# Patient Record
Sex: Male | Born: 1995 | Race: Black or African American | Hispanic: No | Marital: Single | Smoking: Former smoker
Health system: Southern US, Community
[De-identification: ages and names within clinical notes are randomized; demographics above are authoritative.]

## PROBLEM LIST (undated history)

## (undated) DIAGNOSIS — R278 Other lack of coordination: Secondary | ICD-10-CM

## (undated) DIAGNOSIS — F902 Attention-deficit hyperactivity disorder, combined type: Principal | ICD-10-CM

## (undated) DIAGNOSIS — J45909 Unspecified asthma, uncomplicated: Secondary | ICD-10-CM

## (undated) HISTORY — DX: Other lack of coordination: R27.8

## (undated) HISTORY — DX: Attention-deficit hyperactivity disorder, combined type: F90.2

## (undated) HISTORY — DX: Unspecified asthma, uncomplicated: J45.909

---
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
## 2017-03-01 NOTE — Telephone Encounter (Addendum)
Spoke to mom.  She said Ryan Adkins has school and work and she does not know his schedule.  She will have him call as soon as possible to schedule a follow-up appointment.  Patient called back a few minutes later and scheduled appointment.  I reviewed the no-show policy with him.

## 2017-03-08 ENCOUNTER — Telehealth: Payer: Self-pay | Admitting: Pediatrics

## 2017-03-08 ENCOUNTER — Institutional Professional Consult (permissible substitution): Payer: Self-pay | Admitting: Pediatrics

## 2017-03-08 NOTE — Telephone Encounter (Signed)
Patient called back at 9:39 am and said that he over slept. He rescheduled apt for next Friday at 9 am. He is aware of the no show charge for today's missed appt.  jd

## 2017-03-08 NOTE — Telephone Encounter (Signed)
Called  patient  and left a message on all numbers provided in Epic to call the office .

## 2017-03-12 NOTE — Telephone Encounter (Signed)
Patient last seen 2.7.18 (NS'd 5.3 & 8.24.18).  Please call & schedule next follow-up appt during Thanksgiving break.

## 2017-03-13 NOTE — Telephone Encounter (Signed)
See previous msg to JD re next appt needed.

## 2017-03-15 ENCOUNTER — Ambulatory Visit (INDEPENDENT_AMBULATORY_CARE_PROVIDER_SITE_OTHER): Payer: 59 | Admitting: Pediatrics

## 2017-03-15 ENCOUNTER — Encounter: Payer: Self-pay | Admitting: Pediatrics

## 2017-03-15 VITALS — Ht 70.5 in | Wt 227.0 lb

## 2017-03-15 DIAGNOSIS — R278 Other lack of coordination: Secondary | ICD-10-CM

## 2017-03-15 DIAGNOSIS — F902 Attention-deficit hyperactivity disorder, combined type: Secondary | ICD-10-CM

## 2017-03-15 DIAGNOSIS — Z719 Counseling, unspecified: Secondary | ICD-10-CM

## 2017-03-15 DIAGNOSIS — Z79899 Other long term (current) drug therapy: Secondary | ICD-10-CM

## 2017-03-15 DIAGNOSIS — F4010 Social phobia, unspecified: Secondary | ICD-10-CM

## 2017-03-15 MED ORDER — AMPHETAMINE ER 2.5 MG/ML PO SUER: 4.0000 mL | Freq: Every day | ORAL | 0 refills | Status: DC

## 2017-03-15 NOTE — Progress Notes (Signed)
[facility] [address], [place]. [address] [place] [address] Dept: [phone] Dept Fax: [phone] Loc: [phone] Loc Fax: [phone]  Medical Follow-up  Patient ID: Ryan Adkins, male  DOB: [birth year], 21 y.o.  MRN: [id number]  Date of Evaluation: 03/15/17   PCP: Veryl Speak, FNP  Accompanied by: Mother Patient Lives with: mother, father and brother age [name] 21 years  Older brother Renata Caprice, late 30's moved back last summer  HISTORY/CURRENT STATUS:  Chief Complaint - Polite and cooperative and present for medical follow up for medication management of ADHD, dysgraphia and learning differences.  Last follow up February 2018 and prescribed Dyanavel 3 ml up to 6 ml, only using for classes.  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  Homework Time: 30 mins Performance/Grades: average  Doing better on daily medication  Services: IEP/504 Plan   Activities/Exercise: intermittently  Has been going to the gym  MEDICAL HISTORY: Appetite: WNL  Sleep: Bedtime: 2400  Awakens: work days wakes up at [employer] stay up later  Work is about 10 minutes away  Not achieving good night sleep on work nights. Significant lack of sleep on work nights.  Trouble getting comfortable and tosses and turns. Counseled regarding fixing sleep (consider new mattress, mattress topper)   No concerns for toileting. Daily stool, no constipation or diarrhea. Void urine no difficulty. No enuresis.   Participate in daily  oral hygiene to include brushing and flossing.  Individual Medical History/Review of System Changes? No  Allergies: Patient has no known allergies.  Current Medications:  Dyanavel 3 ml occassionally  Medication Side Effects: None  Family Medical/Social History Changes?: No  MENTAL HEALTH: Mental Health Issues:  Denies sadness, loneliness or depression.  No self harm or thoughts of self harm or injury.  Has good peer relations and is not a bully nor is victimized. Worries about brother, has too many tickets for DUI, etc and marijuana. Parents talking about what to do after they die, so patient is worrying Dad has glaucoma  Significant worry with bad dreams and coincidental happenings that he feels relates to his dreams. Completed SCARED today:  Patient score/cut off  Anxiety disorder  18/25 Somatic/panic  3/7 Generalized   0/9 Separation  4/5 Social   8/8 School avoidance 0/3  Discussed and counseled regarding worries, dreams and feelings. Patient does not wish to seek counseling or medication for difficulties.  Review of Systems  Constitutional: Negative.   HENT: Negative.   Eyes: Negative.   Respiratory: Negative.   Cardiovascular: Negative.   Gastrointestinal: Negative.   Genitourinary: Negative.   Musculoskeletal: Negative.   Skin: Negative.   Allergic/Immunologic: Positive for environmental allergies.  Neurological: Negative for seizures and headaches.  Psychiatric/Behavioral: Positive for decreased concentration. Negative for behavioral problems. The patient is nervous/anxious. The patient is not hyperactive.    PHYSICAL EXAM: Vitals:  Today's Vitals   03/15/17 0907  Weight: 227 lb (103 kg)  Height: 5' 10.5" (1.791 m)  , 98 %ile (Z= 1.99) based on CDC  2-20 Years BMI-for-age data using vitals from 03/15/2017. Body mass index is 32.11 kg/m.  General Exam: Physical Exam  Constitutional: He is oriented to person, place, and time. Vital signs are normal.  He appears well-developed and well-nourished. He is cooperative. No distress.  HENT:  Head: Normocephalic.  Right Ear: Tympanic membrane and ear canal normal.  Left Ear: Tympanic membrane and ear canal normal.  Nose: Nose normal.  Mouth/Throat: Uvula is midline, oropharynx is clear and moist and mucous membranes are normal.  Eyes: Pupils are equal, round, and reactive to light. Conjunctivae, EOM and lids are normal.  Neck: Normal range of motion. Neck supple. No thyromegaly present.  Cardiovascular: Normal rate, regular rhythm and intact distal pulses.   Pulmonary/Chest: Effort normal and breath sounds normal.  Abdominal: Soft. Normal appearance.  Genitourinary:  Genitourinary Comments: Deferred  Musculoskeletal: Normal range of motion.  Neurological: He is alert and oriented to person, place, and time. He has normal strength and normal reflexes. He displays no tremor. No cranial nerve deficit or sensory deficit. He exhibits normal muscle tone. He displays a negative Romberg sign. He displays no seizure activity. Coordination and gait normal.  Skin: Skin is warm, dry and intact.  Psychiatric: He has a normal mood and affect. His speech is normal and behavior is normal. Judgment and thought content normal. His mood appears not anxious. His affect is not inappropriate. He is not agitated, not aggressive and not hyperactive. Cognition and memory are normal. He does not express impulsivity or inappropriate judgment. He expresses no suicidal ideation. He expresses no suicidal plans. He is attentive.  Vitals reviewed.   Neurological: oriented to time, place, and person   Testing/Developmental Screens: XBM:WUXLCGI:ASRS 15/6 Reviewed with patient        DIAGNOSES:    ICD-10-CM   1. ADHD (attention deficit hyperactivity disorder), combined type F90.2   2. Dysgraphia R27.8   3. Social anxiety disorder F40.10   4. Medication management Z79.899   5. Patient counseled Z71.9     RECOMMENDATIONS:    Patient Instructions  DISCUSSION: Patient and family counseled regarding the following coordination of care items:  Continue medication as directed Dyanavel 3 to 6 ml as needed for classes/work  Counseled medication administration, effects, and possible side effects.  ADHD medications discussed to include different medications and pharmacologic properties of each. Recommendation for specific medication to include dose, administration, expected effects, possible side effects and the risk to benefit ratio of medication management.  Advised importance of:  Good sleep hygiene (8- 10 hours per night) Attempt to improve bed/frame/mattress Limited screen time (none on school nights, no more than 2 hours on weekends) Technology bedtime to include no phone on work nights by at least 2 hours before bed Regular exercise(outside and active play) Healthy eating (drink water, no sodas/sweet tea, limit portions and no seconds).  Teens need about 9 hours of sleep a night. Younger children need more sleep (10-11 hours a night) and adults need slightly less (7-9 hours each night).  11 Tips to Follow:  1. No caffeine after 3pm: Avoid beverages with caffeine (soda, tea, energy drinks, etc.) especially after 3pm. 2. Don't go to bed hungry: Have your evening meal at least 3 hrs. before going to sleep. It's fine to have a small bedtime snack such as a glass of milk and a few crackers but don't have a big meal. 3. Have a nightly routine before bed: Plan on "winding down" before you go to sleep. Begin relaxing about 1  hour before you go to bed. Try doing a quiet activity such as listening to calming music, reading a book or meditating. 4. Turn off the TV and ALL electronics including video games, tablets, laptops, etc. 1 hour before sleep, and keep them out of the bedroom. 5. Turn off your cell phone and all notifications (new email and text alerts) or even better, leave your phone outside your room while you sleep.  Studies have shown that a part of your brain continues to respond to certain lights and sounds even while you're still asleep. 6. Make your bedroom quiet, dark and cool. If you can't control the noise, try wearing earplugs or using a fan to block out other sounds. 7. Practice relaxation techniques. Try reading a book or meditating or drain your brain by writing a list of what you need to do the next day. 8. Don't nap unless you feel sick: you'll have a better night's sleep. 9. Don't smoke, or quit if you do. Nicotine, alcohol, and marijuana can all keep you awake. Talk to your health care provider if you need help with substance use. 10. Most importantly, wake up at the same time every day (or within 1 hour of your usual wake up time) EVEN on the weekends. A regular wake up time promotes sleep hygiene and prevents sleep problems. 11. Reduce exposure to bright light in the last three hours of the day before going to sleep. Maintaining good sleep hygiene and having good sleep habits lower your risk of developing sleep problems. Getting better sleep can also improve your concentration and alertness. Try the simple steps in this guide. If you still have trouble getting enough rest, make an appointment with your health care provider.    Patient verbalized understanding of all topics discussed.    NEXT APPOINTMENT: Return in about 6 months (around 09/12/2017) for Medical Follow up. Medical Decision-making: More than 50% of the appointment was spent counseling and discussing diagnosis and management of symptoms with the patient and family.   Leticia Penna, NP Counseling Time: 40 Total Contact Time: 50

## 2017-03-15 NOTE — Patient Instructions (Addendum)
DISCUSSION: Patient and family counseled regarding the following coordination of care items:  Continue medication as directed Dyanavel 3 to 6 ml as needed for classes/work  Counseled medication administration, effects, and possible side effects.  ADHD medications discussed to include different medications and pharmacologic properties of each. Recommendation for specific medication to include dose, administration, expected effects, possible side effects and the risk to benefit ratio of medication management.  Advised importance of:  Good sleep hygiene (8- 10 hours per night) Attempt to improve bed/frame/mattress Limited screen time (none on school nights, no more than 2 hours on weekends) Technology bedtime to include no phone on work nights by at least 2 hours before bed Regular exercise(outside and active play) Healthy eating (drink water, no sodas/sweet tea, limit portions and no seconds).  Teens need about 9 hours of sleep a night. Younger children need more sleep (10-11 hours a night) and adults need slightly less (7-9 hours each night).  11 Tips to Follow:  1. No caffeine after 3pm: Avoid beverages with caffeine (soda, tea, energy drinks, etc.) especially after 3pm. 2. Don't go to bed hungry: Have your evening meal at least 3 hrs. before going to sleep. It's fine to have a small bedtime snack such as a glass of milk and a few crackers but don't have a big meal. 3. Have a nightly routine before bed: Plan on "winding down" before you go to sleep. Begin relaxing about 1 hour before you go to bed. Try doing a quiet activity such as listening to calming music, reading a book or meditating. 4. Turn off the TV and ALL electronics including video games, tablets, laptops, etc. 1 hour before sleep, and keep them out of the bedroom. 5. Turn off your cell phone and all notifications (new email and text alerts) or even better, leave your phone outside your room while you sleep. Studies have shown that  a part of your brain continues to respond to certain lights and sounds even while you're still asleep. 6. Make your bedroom quiet, dark and cool. If you can't control the noise, try wearing earplugs or using a fan to block out other sounds. 7. Practice relaxation techniques. Try reading a book or meditating or drain your brain by writing a list of what you need to do the next day. 8. Don't nap unless you feel sick: you'll have a better night's sleep. 9. Don't smoke, or quit if you do. Nicotine, alcohol, and marijuana can all keep you awake. Talk to your health care provider if you need help with substance use. 10. Most importantly, wake up at the same time every day (or within 1 hour of your usual wake up time) EVEN on the weekends. A regular wake up time promotes sleep hygiene and prevents sleep problems. 11. Reduce exposure to bright light in the last three hours of the day before going to sleep. Maintaining good sleep hygiene and having good sleep habits lower your risk of developing sleep problems. Getting better sleep can also improve your concentration and alertness. Try the simple steps in this guide. If you still have trouble getting enough rest, make an appointment with your health care provider.

## 2017-06-05 ENCOUNTER — Ambulatory Visit (HOSPITAL_COMMUNITY)
Admission: EM | Admit: 2017-06-05 | Discharge: 2017-06-05 | Disposition: A | Discharge status: Home / Self Care | Payer: 59 | Attending: Family Medicine | Admitting: Family Medicine

## 2017-06-05 ENCOUNTER — Encounter (HOSPITAL_COMMUNITY): Payer: Self-pay | Admitting: Emergency Medicine

## 2017-06-05 ENCOUNTER — Ambulatory Visit: Payer: Self-pay | Admitting: Internal Medicine

## 2017-06-05 DIAGNOSIS — J00 Acute nasopharyngitis [common cold]: Secondary | ICD-10-CM

## 2017-06-05 MED ORDER — PHENOL 1.4 % MT LIQD: 1.0000 | OROMUCOSAL | 0 refills | Status: DC | PRN

## 2017-06-05 MED ORDER — FLUTICASONE PROPIONATE 50 MCG/ACT NA SUSP: 2.0000 | Freq: Every day | NASAL | 0 refills | Status: AC

## 2017-06-05 MED ORDER — ALBUTEROL SULFATE HFA 108 (90 BASE) MCG/ACT IN AERS: 2.0000 | INHALATION_SPRAY | Freq: Four times a day (QID) | RESPIRATORY_TRACT | 0 refills | Status: DC | PRN

## 2017-06-05 MED ORDER — CETIRIZINE-PSEUDOEPHEDRINE ER 5-120 MG PO TB12: 1.0000 | ORAL_TABLET | Freq: Every day | ORAL | 0 refills | Status: AC

## 2017-06-05 NOTE — ED Provider Notes (Signed)
[facility]    CSN: [id number] Arrival date & time: 06/05/17  1651     History   Chief Complaint Chief Complaint  Patient presents with  . URI    HPI Ryan Adkins is a 21 y.o. male.   21 year old male comes in with history of asthma, ADHD comes in for 4 day history of URI symptoms. Nonproductive cough, nasal congestion, rhinorrhea, sore throat, headache. Fever with Tmax 102, was given tylenol, last dose yesterday. Has had to use albuterol at night due to wheezing/coughing. Positive sick contact. Never smoker.       Past Medical History:  Diagnosis Date  . ADHD (attention deficit hyperactivity disorder), combined type 05/12/2010   Qualifier: Diagnosis of  By: Beverely Lowabori MD, Natalia LeatherwoodKatherine    . Asthma   . Dysgraphia 11/30/2015    Patient Active Problem List   Diagnosis Date Noted  . Biceps tendinitis of right shoulder 09/06/2016  . Cervical paraspinal muscle spasm 09/06/2016  . Dysgraphia 11/30/2015  . Dry skin 09/30/2014  . Asthma 09/30/2014  . ADHD (attention deficit hyperactivity disorder), combined type 05/12/2010  . ALLERGIC RHINITIS, SEASONAL 05/12/2010    History reviewed. No pertinent surgical history.     Home Medications    Prior to Admission medications   Medication Sig Start Date End Date Taking? Authorizing Provider  albuterol (PROVENTIL HFA;VENTOLIN HFA) 108 (90 Base) MCG/ACT inhaler Inhale 2 puffs into the lungs every 6 (six) hours as needed for wheezing or shortness of breath. 06/05/17   Cathie HoopsYu, Jariel Drost V, PA-C  Albuterol Sulfate (PROAIR RESPICLICK) 108 (90 BASE) MCG/ACT AEPB Inhale 1-2 puffs into the lungs every 4 (four) hours as needed. 09/30/14   Veryl Speakalone, Gregory D, FNP  Amphetamine ER (DYANAVEL XR) 2.5 MG/ML SUER Take 4-8 mLs by mouth daily after breakfast. 03/15/17   Crump, Bobi A, NP  cetirizine (ZYRTEC) 1 MG/ML syrup Take 10 mg by mouth daily.    [provider]  cetirizine-pseudoephedrine (ZYRTEC-D) 5-120 MG tablet Take 1 tablet by  mouth daily. 06/05/17   Cathie HoopsYu, Stewart Pimenta V, PA-C  fluticasone (FLONASE) 50 MCG/ACT nasal spray Place 2 sprays into both nostrils daily. 06/05/17   Cathie HoopsYu, Betzabe Bevans V, PA-C  montelukast (SINGULAIR) 10 MG tablet Take 1 tablet (10 mg total) by mouth at bedtime. 04/02/15   Judi SaaSmith, Zachary M, DO  phenol (CHLORASEPTIC) 1.4 % LIQD Use as directed 1 spray in the mouth or throat as needed for throat irritation / pain. 06/05/17   Cathie HoopsYu, Margerie Fraiser V, PA-C  triamcinolone (NASACORT) 55 MCG/ACT AERO nasal inhaler Place 2 sprays into the nose daily.    [provider]    Family History Family History  Problem Relation Age of Onset  . Diabetes Mother   . Cancer Other        Prostate Cancer-Grandfather  . Hyperlipidemia Neg Hx   . Hypertension Neg Hx   . Stroke Neg Hx     Social History Social History   Tobacco Use  . Smoking status: Former Smoker    Last attempt to quit: 03/14/2016    Years since quitting: 1.2  . Smokeless tobacco: Never Used  Substance Use Topics  . Alcohol use: No    Alcohol/week: 0.0 oz  . Drug use: No    Comment: stopped Summer 2018     Allergies   Patient has no known allergies.   Review of Systems Review of Systems  Reason unable to perform ROS: See HPI as above.  Physical Exam Triage Vital Signs ED Triage Vitals [06/05/17 1710]  Enc Vitals Group     BP 109/70     Pulse Rate 78     Resp 18     Temp 98.5 F (36.9 C)     Temp Source Oral     SpO2 100 %     Weight      Height      Head Circumference      Peak Flow      Pain Score      Pain Loc      Pain Edu?      Excl. in GC?    No data found.  Updated Vital Signs BP 109/70 (BP Location: Left Arm)   Pulse 78   Temp 98.5 F (36.9 C) (Oral)   Resp 18   SpO2 100%   Physical Exam  Constitutional: He is oriented to person, place, and time. He appears well-developed and well-nourished. No distress.  HENT:  Head: Normocephalic and atraumatic.  Right Ear: External ear and ear canal normal. Tympanic membrane is  erythematous. Tympanic membrane is not bulging.  Left Ear: External ear and ear canal normal. Tympanic membrane is erythematous. Tympanic membrane is not bulging.  Nose: Mucosal edema and rhinorrhea present. Right sinus exhibits maxillary sinus tenderness and frontal sinus tenderness. Left sinus exhibits maxillary sinus tenderness and frontal sinus tenderness.  Mouth/Throat: Uvula is midline, oropharynx is clear and moist and mucous membranes are normal. Tonsils are 1+ on the right. Tonsils are 1+ on the left. No tonsillar exudate.  Eyes: Conjunctivae are normal. Pupils are equal, round, and reactive to light.  Neck: Normal range of motion. Neck supple.  Cardiovascular: Normal rate, regular rhythm and normal heart sounds. Exam reveals no gallop and no friction rub.  No murmur heard. Pulmonary/Chest: Effort normal.  Mild scattered wheezing.   Lymphadenopathy:    He has no cervical adenopathy.  Neurological: He is alert and oriented to person, place, and time.  Skin: Skin is warm and dry.  Psychiatric: He has a normal mood and affect. His behavior is normal. Judgment normal.     UC Treatments / Results  Labs (all labs ordered are listed, but only abnormal results are displayed) Labs Reviewed - No data to display  EKG  EKG Interpretation None       Radiology No results found.  Procedures Procedures (including critical care time)  Medications Ordered in UC Medications - No data to display   Initial Impression / Assessment and Plan / UC Course  I have reviewed the triage vital signs and the nursing notes.  Pertinent labs & imaging results that were available during my care of the patient were reviewed by me and considered in my medical decision making (see chart for details).    Discussed with patient history and exam most consistent with viral URI. Albuterol for wheezing/shortness of breath. Symptomatic treatment as needed. Push fluids. Return precautions given.    Final  Clinical Impressions(s) / UC Diagnoses   Final diagnoses:  Acute nasopharyngitis    ED Discharge Orders        Ordered    albuterol (PROVENTIL HFA;VENTOLIN HFA) 108 (90 Base) MCG/ACT inhaler  Every 6 hours PRN     06/05/17 1746    cetirizine-pseudoephedrine (ZYRTEC-D) 5-120 MG tablet  Daily     06/05/17 1746    fluticasone (FLONASE) 50 MCG/ACT nasal spray  Daily     06/05/17 1746    phenol (CHLORASEPTIC) 1.4 %  LIQD  As needed     06/05/17 1746        Belinda Fisher, PA-C 06/05/17 1842

## 2017-06-05 NOTE — Discharge Instructions (Signed)
Phenol for sore throat. Start flonase, zyrtec-D for nasal congestion. Albuterol for wheezing/shortness of breath. You can use over the counter nasal saline rinse such as neti pot for nasal congestion. Keep hydrated, your urine should be clear to pale yellow in color. Tylenol/motrin for fever and pain. Monitor for any worsening of symptoms, chest pain, shortness of breath, wheezing, swelling of the throat, follow up for reevaluation.   For sore throat try using a honey-based tea. Use 3 teaspoons of honey with juice squeezed from half lemon. Place shaved pieces of ginger into 1/2-1 cup of water and warm over stove top. Then mix the ingredients and repeat every 4 hours as needed.

## 2017-06-05 NOTE — ED Triage Notes (Signed)
Pt here with URI sx and cough; pt sts pain with cough

## 2017-06-21 ENCOUNTER — Institutional Professional Consult (permissible substitution): Payer: Self-pay | Admitting: Pediatrics

## 2017-06-28 ENCOUNTER — Ambulatory Visit: Payer: 59 | Admitting: Pediatrics

## 2017-06-28 ENCOUNTER — Encounter: Payer: Self-pay | Admitting: Pediatrics

## 2017-06-28 VITALS — Ht 70.25 in | Wt 225.0 lb

## 2017-06-28 DIAGNOSIS — R278 Other lack of coordination: Secondary | ICD-10-CM | POA: Diagnosis not present

## 2017-06-28 DIAGNOSIS — Z79899 Other long term (current) drug therapy: Secondary | ICD-10-CM | POA: Diagnosis not present

## 2017-06-28 DIAGNOSIS — Z7189 Other specified counseling: Secondary | ICD-10-CM

## 2017-06-28 DIAGNOSIS — F902 Attention-deficit hyperactivity disorder, combined type: Secondary | ICD-10-CM | POA: Diagnosis not present

## 2017-06-28 DIAGNOSIS — Z719 Counseling, unspecified: Secondary | ICD-10-CM | POA: Diagnosis not present

## 2017-06-28 MED ORDER — METHYLPHENIDATE 30 MG/9HR TD PTCH: 1.0000 | MEDICATED_PATCH | Freq: Every morning | TRANSDERMAL | 0 refills | Status: DC

## 2017-06-28 NOTE — Progress Notes (Signed)
[facility] [address] [place]. [address] [place] [address] Dept: [phone] Dept Fax: [phone] Loc: [phone] Loc Fax: [phone]  Medical Follow-up  Patient ID: Ryan Adkins, male  DOB: 05-25-1996, 21 y.o.  MRN: [id number]  Date of Evaluation: 06/28/17  PCP: Veryl Speak, FNP  Accompanied by: Self Patient Lives with: mother, father and brother age 42 at home  HISTORY/CURRENT STATUS:  Chief Complaint - Polite and cooperative and present for medical follow up for medication management of ADHD, dysgraphia and learning differences.  Last follow up in Fall 2018 , September.  Currently prescribed Dyanavel 5 ml = 20 mg of Adderall XR.  Wants to go back to the patch, Daytrana and dose will be 30 mg will start with 1/2 patch.    EDUCATION: School: [employer] apprenticeship program at [employer] Work and school 10 hours per day. Finished math class - still has some to finish  Typical Day of work 0600 to 1100, school in [place] 1400 back to work [employer] work, while in school  Work is "work study" - Engineer, materials, apprenticing.  Liked water jet, has to move and learn jobs. Work continues full time when classes are off session.  Became full time at work in October, makes 9 dollars an hour with 13.50 for over time.  Goes back in January, semester ended.  Uses money for gaming, insurance for car.  Pays some for food.  Games playing fortnight, has group of friends, may try to you tube gaming.  MEDICAL HISTORY: Appetite: WNL  Sleep: Bedtime: all over the place.  Some late nights up at 0200 even with work at 0600 Awakens: up early for work, M-Th at [employer] late on Friday Sleep Concerns: Initiation/Maintenance/Other: Asleep easily, sleeps through the night, feels well-rested.  No Sleep concerns. No concerns for toileting.  Daily stool, no constipation or diarrhea. Void urine no difficulty. No enuresis.   Participate in daily oral hygiene to include brushing and flossing.  Individual Medical History/Review of System Changes? No  Allergies: Patient has no known allergies.  Current Medications:  Dyanavel 5 ml on school days Medication Side Effects: None  Family Medical/Social History Changes?: No  MENTAL HEALTH: Mental Health Issues:  Denies sadness, loneliness or depression. No self harm or thoughts of self harm or injury. Denies fears, worries and anxieties. Has good peer relations and is not a bully nor is victimized.  Discussed how his dreams/visions come true.  Review of Systems  Constitutional: Negative.   HENT: Negative.   Eyes: Negative.   Respiratory: Negative.   Cardiovascular: Negative.   Gastrointestinal: Negative.   Genitourinary: Negative.   Musculoskeletal: Negative.   Skin: Negative.   Allergic/Immunologic: Positive for environmental allergies.  Neurological: Negative for seizures and headaches.  Hematological: Negative.   Psychiatric/Behavioral: Positive for sleep disturbance. Negative for behavioral problems and decreased concentration. The patient is not nervous/anxious and is not hyperactive.    PHYSICAL EXAM: Vitals:  Today's Vitals   06/28/17 1053  Weight: 225 lb (102.1 kg)  Height: 5' 10.25" (1.784 m)  , 97 %ile (Z= 1.95) based on CDC (Boys, 2-20 Years) BMI-for-age based on BMI available as of 06/28/2017. Body mass index is 32.05 kg/m.  General Exam: Physical Exam  Constitutional: He is oriented to person, place, and time. Vital signs are normal. He appears well-developed and well-nourished. He is cooperative. No distress.  HENT:  Head:  Normocephalic.  Right Ear: Tympanic membrane and ear canal normal.  Left Ear: Tympanic membrane and ear canal normal.  Nose: Nose normal.  Mouth/Throat: Uvula is midline, oropharynx is clear and moist and mucous membranes are  normal.  Eyes: Conjunctivae, EOM and lids are normal. Pupils are equal, round, and reactive to light.  Neck: Normal range of motion. Neck supple. No thyromegaly present.  Cardiovascular: Normal rate, regular rhythm and intact distal pulses.  Pulmonary/Chest: Effort normal and breath sounds normal.  Abdominal: Soft. Normal appearance.  Genitourinary:  Genitourinary Comments: Deferred  Musculoskeletal: Normal range of motion.  Neurological: He is alert and oriented to person, place, and time. He has normal strength and normal reflexes. He displays no tremor. No cranial nerve deficit or sensory deficit. He exhibits normal muscle tone. He displays a negative Romberg sign. He displays no seizure activity. Coordination and gait normal.  Skin: Skin is warm, dry and intact.  Psychiatric: He has a normal mood and affect. His speech is normal and behavior is normal. Judgment and thought content normal. His mood appears not anxious. His affect is not inappropriate. He is not agitated, not aggressive and not hyperactive. Cognition and memory are normal. He does not express impulsivity or inappropriate judgment. He expresses no suicidal ideation. He expresses no suicidal plans. He is attentive.  Vitals reviewed.  Neurological: oriented to place and person  Testing/Developmental Screens: CGI:18/10  Reviewed with patient     DIAGNOSES:    ICD-10-CM   1. ADHD (attention deficit hyperactivity disorder), combined type F90.2   2. Dysgraphia R27.8   3. Medication management Z79.899   4. Patient counseled Z71.9   5. Counseling and coordination of care Z71.89     RECOMMENDATIONS:   Patient Instructions  DISCUSSION: Patient and family counseled regarding the following coordination of care items:  Continue medication as directed Discontinue Dyanavel Retrial Daytrana patch 30 mg  Begin with 1/2 patch for on e or two days, then full patch. Apply every morning consistently. Remove after 9 or ten  hours.  Counseled medication administration, effects, and possible side effects.  ADHD medications discussed to include different medications and pharmacologic properties of each. Recommendation for specific medication to include dose, administration, expected effects, possible side effects and the risk to benefit ratio of medication management.  Advised importance of:  Good sleep hygiene (8- 10 hours per night) Limited screen time (none on school nights, no more than 2 hours on weekends) Regular exercise(outside and active play) Healthy eating (drink water, no sodas/sweet tea, limit portions and no seconds).  Counseling at this visit included the review of old records and/or current chart with the patient and family.   Counseling included the following discussion points:  Recent health history and today's examination Growth and development with anticipatory guidance provided regarding brain growth, executive function maturation and pubertal development School progress and continued advocay for appropriate accommodations to include maintain Structure, routine, organization, reward, motivation and consequences.  Teens need about 9 hours of sleep a night. Younger children need more sleep (10-11 hours a night) and adults need slightly less (7-9 hours each night).  11 Tips to Follow:  1. No caffeine after 3pm: Avoid beverages with caffeine (soda, tea, energy drinks, etc.) especially after 3pm. 2. Don't go to bed hungry: Have your evening meal at least 3 hrs. before going to sleep. It's fine to have a small bedtime snack such as a glass of milk and a few crackers but don't have a big meal. 3. Have  a nightly routine before bed: Plan on "winding down" before you go to sleep. Begin relaxing about 1 hour before you go to bed. Try doing a quiet activity such as listening to calming music, reading a book or meditating. 4. Turn off the TV and ALL electronics including video games, tablets, laptops, etc. 1  hour before sleep, and keep them out of the bedroom. 5. Turn off your cell phone and all notifications (new email and text alerts) or even better, leave your phone outside your room while you sleep. Studies have shown that a part of your brain continues to respond to certain lights and sounds even while you're still asleep. 6. Make your bedroom quiet, dark and cool. If you can't control the noise, try wearing earplugs or using a fan to block out other sounds. 7. Practice relaxation techniques. Try reading a book or meditating or drain your brain by writing a list of what you need to do the next day. 8. Don't nap unless you feel sick: you'll have a better night's sleep. 9. Don't smoke, or quit if you do. Nicotine, alcohol, and marijuana can all keep you awake. Talk to your health care provider if you need help with substance use. 10. Most importantly, wake up at the same time every day (or within 1 hour of your usual wake up time) EVEN on the weekends. A regular wake up time promotes sleep hygiene and prevents sleep problems. 11. Reduce exposure to bright light in the last three hours of the day before going to sleep. Maintaining good sleep hygiene and having good sleep habits lower your risk of developing sleep problems. Getting better sleep can also improve your concentration and alertness. Try the simple steps in this guide. If you still have trouble getting enough rest, make an appointment with your health care provider.      Patient verbalized understanding of all topics discussed.   NEXT APPOINTMENT: Return in about 3 months (around 09/26/2017) for Medical Follow up. Medical Decision-making: More than 50% of the appointment was spent counseling and discussing diagnosis and management of symptoms with the patient and family.   Leticia PennaBobi A Preeya Cleckley, NP Counseling Time: 40 Total Contact Time: 50

## 2017-06-28 NOTE — Patient Instructions (Addendum)
DISCUSSION: Patient and family counseled regarding the following coordination of care items:  Continue medication as directed Discontinue Dyanavel Retrial Daytrana patch 30 mg  Begin with 1/2 patch for on e or two days, then full patch. Apply every morning consistently. Remove after 9 or ten hours.  Counseled medication administration, effects, and possible side effects.  ADHD medications discussed to include different medications and pharmacologic properties of each. Recommendation for specific medication to include dose, administration, expected effects, possible side effects and the risk to benefit ratio of medication management.  Advised importance of:  Good sleep hygiene (8- 10 hours per night) Limited screen time (none on school nights, no more than 2 hours on weekends) Regular exercise(outside and active play) Healthy eating (drink water, no sodas/sweet tea, limit portions and no seconds).  Counseling at this visit included the review of old records and/or current chart with the patient and family.   Counseling included the following discussion points:  Recent health history and today's examination Growth and development with anticipatory guidance provided regarding brain growth, executive function maturation and pubertal development School progress and continued advocay for appropriate accommodations to include maintain Structure, routine, organization, reward, motivation and consequences.  Teens need about 9 hours of sleep a night. Younger children need more sleep (10-11 hours a night) and adults need slightly less (7-9 hours each night).  11 Tips to Follow:  1. No caffeine after 3pm: Avoid beverages with caffeine (soda, tea, energy drinks, etc.) especially after 3pm. 2. Don't go to bed hungry: Have your evening meal at least 3 hrs. before going to sleep. It's fine to have a small bedtime snack such as a glass of milk and a few crackers but don't have a big meal. 3. Have a  nightly routine before bed: Plan on "winding down" before you go to sleep. Begin relaxing about 1 hour before you go to bed. Try doing a quiet activity such as listening to calming music, reading a book or meditating. 4. Turn off the TV and ALL electronics including video games, tablets, laptops, etc. 1 hour before sleep, and keep them out of the bedroom. 5. Turn off your cell phone and all notifications (new email and text alerts) or even better, leave your phone outside your room while you sleep. Studies have shown that a part of your brain continues to respond to certain lights and sounds even while you're still asleep. 6. Make your bedroom quiet, dark and cool. If you can't control the noise, try wearing earplugs or using a fan to block out other sounds. 7. Practice relaxation techniques. Try reading a book or meditating or drain your brain by writing a list of what you need to do the next day. 8. Don't nap unless you feel sick: you'll have a better night's sleep. 9. Don't smoke, or quit if you do. Nicotine, alcohol, and marijuana can all keep you awake. Talk to your health care provider if you need help with substance use. 10. Most importantly, wake up at the same time every day (or within 1 hour of your usual wake up time) EVEN on the weekends. A regular wake up time promotes sleep hygiene and prevents sleep problems. 11. Reduce exposure to bright light in the last three hours of the day before going to sleep. Maintaining good sleep hygiene and having good sleep habits lower your risk of developing sleep problems. Getting better sleep can also improve your concentration and alertness. Try the simple steps in this guide. If you still  have trouble getting enough rest, make an appointment with your health care provider.

## 2017-07-01 ENCOUNTER — Telehealth: Payer: Self-pay | Admitting: Pediatrics

## 2017-07-01 MED ORDER — DAYTRANA 30 MG/9HR TD PTCH: 1.0000 | MEDICATED_PATCH | Freq: Every morning | TRANSDERMAL | 0 refills | Status: AC

## 2017-07-01 NOTE — Telephone Encounter (Signed)
Mother brings back 3 Daytrana Rx written by B. [employer] Says insurance requires a 90 days Rx at pharmacy New Rx provided for 90 days supply with Dr Remus BlakeKumar's supervisory information on it.

## 2017-07-02 ENCOUNTER — Telehealth: Payer: Self-pay | Admitting: Pediatrics

## 2017-07-02 NOTE — Telephone Encounter (Signed)
[facility]ana 30 mg.  Patient last seen 06/28/17, next appointment 09/27/17.

## 2017-07-02 NOTE — Telephone Encounter (Signed)
Second request received.

## 2017-07-02 NOTE — Telephone Encounter (Signed)
PA submitted via Cover My Meds. Gwyneth RevelsLANDO Preiss II Key: [id number] - PA Case ID: [id number]  Need help? Call us at [phone] Outcome Approved  today Your PA request has been approved. Additional information will be provided in the approval communication. (Message 1145) Drug Daytrana 30MG Ricki Miller/9HR TD [facility] Form [employer] Electronic PA Form (NCPDP)

## 2017-09-27 ENCOUNTER — Institutional Professional Consult (permissible substitution): Payer: Self-pay | Admitting: Pediatrics

## 2018-07-26 ENCOUNTER — Ambulatory Visit: Payer: 59 | Admitting: Internal Medicine

## 2018-07-26 ENCOUNTER — Encounter: Payer: Self-pay | Admitting: Internal Medicine

## 2018-07-26 VITALS — BP 108/66 | HR 72 | Temp 97.7°F | Ht 70.25 in | Wt 237.0 lb

## 2018-07-26 DIAGNOSIS — J0100 Acute maxillary sinusitis, unspecified: Secondary | ICD-10-CM | POA: Diagnosis not present

## 2018-07-26 MED ORDER — AMOXICILLIN 500 MG PO TABS: 1000.0000 mg | ORAL_TABLET | Freq: Two times a day (BID) | ORAL | 0 refills | Status: AC

## 2018-07-26 NOTE — Progress Notes (Signed)
Subjective:    Patient ID: Ryan Adkins, male    DOB: 11/27/1995, 23 y.o.   MRN: [id number]  HPI Here due to persistent respiratory symptoms With mom  Mostly runny nose  Some green and yellow drainage--but mostly clear Some cough Started with just runny nose months ago--but got worse  Sore throat before---but not consistent Fever a few weeks ago--when it started being worse. Not in last 2 weeks No ear pain No SOB  Taking mucinex D and some allergy tablets May help some  Current Outpatient Medications on File Prior to Visit  Medication Sig Dispense Refill  . Albuterol Sulfate (PROAIR RESPICLICK) 108 (90 BASE) MCG/ACT AEPB Inhale 1-2 puffs into the lungs every 4 (four) hours as needed. 1 each 0  . cetirizine-pseudoephedrine (ZYRTEC-D) 5-120 MG tablet Take 1 tablet by mouth daily. 15 tablet 0  . DAYTRANA 30 MG/9HR Place 1 patch onto the skin every morning. wear patch for 9 hours only each day 90 patch 0  . fluticasone (FLONASE) 50 MCG/ACT nasal spray Place 2 sprays into both nostrils daily. 1 g 0  . montelukast (SINGULAIR) 10 MG tablet Take 1 tablet (10 mg total) by mouth at bedtime. 90 tablet 3  . triamcinolone (NASACORT) 55 MCG/ACT AERO nasal inhaler Place 2 sprays into the nose daily.     No current facility-administered medications on file prior to visit.     No Known Allergies  Past Medical History:  Diagnosis Date  . ADHD (attention deficit hyperactivity disorder), combined type 05/12/2010   Qualifier: Diagnosis of  By: Beverely Lowabori MD, Natalia LeatherwoodKatherine    . Asthma   . Dysgraphia 11/30/2015    History reviewed. No pertinent surgical history.  Family History  Problem Relation Age of Onset  . Diabetes Mother   . Cancer Other        Prostate Cancer-Grandfather  . Hyperlipidemia Neg Hx   . Hypertension Neg Hx   . Stroke Neg Hx     Social History   Socioeconomic History  . Marital status: Single    Spouse name: Not on file  . Number of children: Not on file  .  Years of education: Not on file  . Highest education level: Not on file  Occupational History  . Occupation: Consulting civil engineertudent  Social Needs  . Financial resource strain: Not on file  . Food insecurity:    Worry: Not on file    Inability: Not on file  . Transportation needs:    Medical: Not on file    Non-medical: Not on file  Tobacco Use  . Smoking status: Former Smoker    Last attempt to quit: 03/14/2016    Years since quitting: 2.3  . Smokeless tobacco: Never Used  Substance and Sexual Activity  . Alcohol use: No    Alcohol/week: 0.0 standard drinks  . Drug use: No    Types: Marijuana    Comment: stopped Summer 2018  . Sexual activity: Never  Lifestyle  . Physical activity:    Days per week: Not on file    Minutes per session: Not on file  . Stress: Not on file  Relationships  . Social connections:    Talks on phone: Not on file    Gets together: Not on file    Attends religious service: Not on file    Active member of club or organization: Not on file    Attends meetings of clubs or organizations: Not on file    Relationship status: Not on  file  . Intimate partner violence:    Fear of current or ex partner: Not on file    Emotionally abused: Not on file    Physically abused: Not on file    Forced sexual activity: Not on file  Other Topics Concern  . Not on file  Social History Narrative   Regular exercise-yes   Caffeine Use-no   Review of Systems No vomiting or diarrhea Appetite is okay No teeth problems Chronic allergy problems    Objective:   Physical Exam  Constitutional: He appears well-developed. No distress.  HENT:  Mouth/Throat: Oropharynx is clear and moist. No oropharyngeal exudate.  Mild maxillary tenderness Moderate nasal inflammation TMs normal  Neck: No thyromegaly present.  Respiratory: Effort normal and breath sounds normal. No respiratory distress. He has no wheezes. He has no rales.  Lymphadenopathy:    He has no cervical adenopathy.            Assessment & Plan:

## 2018-07-26 NOTE — Assessment & Plan Note (Signed)
Mild but persistent illness for over 3 weeks Discussed supportive care Amoxil

## 2018-10-31 ENCOUNTER — Ambulatory Visit: Payer: Self-pay | Admitting: Nurse Practitioner

## 2020-05-29 ENCOUNTER — Ambulatory Visit (HOSPITAL_COMMUNITY)
Admission: EM | Admit: 2020-05-29 | Discharge: 2020-05-29 | Disposition: A | Discharge status: Home / Self Care | Payer: Commercial Managed Care - PPO | Attending: Emergency Medicine | Admitting: Emergency Medicine

## 2020-05-29 ENCOUNTER — Encounter (HOSPITAL_COMMUNITY): Payer: Self-pay

## 2020-05-29 ENCOUNTER — Other Ambulatory Visit: Payer: Self-pay

## 2020-05-29 DIAGNOSIS — J029 Acute pharyngitis, unspecified: Secondary | ICD-10-CM | POA: Diagnosis present

## 2020-05-29 DIAGNOSIS — Z87891 Personal history of nicotine dependence: Secondary | ICD-10-CM | POA: Diagnosis not present

## 2020-05-29 DIAGNOSIS — Z20822 Contact with and (suspected) exposure to covid-19: Secondary | ICD-10-CM | POA: Diagnosis not present

## 2020-05-29 DIAGNOSIS — J45909 Unspecified asthma, uncomplicated: Secondary | ICD-10-CM | POA: Insufficient documentation

## 2020-05-29 DIAGNOSIS — J069 Acute upper respiratory infection, unspecified: Secondary | ICD-10-CM | POA: Insufficient documentation

## 2020-05-29 DIAGNOSIS — R059 Cough, unspecified: Secondary | ICD-10-CM | POA: Diagnosis not present

## 2020-05-29 LAB — POCT URINALYSIS DIPSTICK, ED / UC
Glucose, UA: NEGATIVE mg/dL
Leukocytes,Ua: NEGATIVE

## 2020-05-29 LAB — SARS CORONAVIRUS 2 (TAT 6-24 HRS): SARS Coronavirus 2: NEGATIVE

## 2020-05-29 NOTE — Discharge Instructions (Addendum)
Your rapid strep test is negative.  A throat culture is pending; we will call you if it is positive requiring treatment.    Your COVID test is pending.  You should self quarantine until the test result is back.    Take Tylenol as needed for fever or discomfort.  Rest and keep yourself hydrated.    Follow up with your primary care provider if your symptoms are not improving.      

## 2020-05-29 NOTE — ED Triage Notes (Signed)
Pt present sore throat with white patches on the back of his throat. Pt states he noticed the patched yesterday. Pt states that he started a slight cough today.

## 2020-05-29 NOTE — ED Provider Notes (Signed)
[facility]    CSN: [id number] Arrival date & time: 05/29/20  1622      History   Chief Complaint Chief Complaint  Patient presents with  . Sore Throat    HPI Ryan Adkins is a 24 y.o. male.   Patient presents with sore throat and white patches in the back of his throat x1 day.  He also reports a mild nonproductive cough today.  He denies fever, rash, shortness of breath, vomiting, diarrhea, or other symptoms.  No treatments attempted at home.  Patient is fully vaccinated for COVID.  His medical history includes asthma, ADHD, allergies.  The history is provided by the patient and medical records.    Past Medical History:  Diagnosis Date  . ADHD (attention deficit hyperactivity disorder), combined type 05/12/2010   Qualifier: Diagnosis of  By: Beverely Low MD, Natalia Leatherwood    . Asthma   . Dysgraphia 11/30/2015    Patient Active Problem List   Diagnosis Date Noted  . Acute non-recurrent maxillary sinusitis 07/26/2018  . Biceps tendinitis of right shoulder 09/06/2016  . Cervical paraspinal muscle spasm 09/06/2016  . Dysgraphia 11/30/2015  . Dry skin 09/30/2014  . Asthma 09/30/2014  . ADHD (attention deficit hyperactivity disorder), combined type 05/12/2010  . ALLERGIC RHINITIS, SEASONAL 05/12/2010    History reviewed. No pertinent surgical history.     Home Medications    Prior to Admission medications   Medication Sig Start Date End Date Taking? Authorizing Provider  Albuterol Sulfate (PROAIR RESPICLICK) 108 (90 BASE) MCG/ACT AEPB Inhale 1-2 puffs into the lungs every 4 (four) hours as needed. 09/30/14   Veryl Speak, FNP  cetirizine-pseudoephedrine (ZYRTEC-D) 5-120 MG tablet Take 1 tablet by mouth daily. 06/05/17   Yu, Amy V, PA-C  DAYTRANA 30 MG/9HR Place 1 patch onto the skin every morning. wear patch for 9 hours only each day 07/01/17   Lorina Rabon, NP  fluticasone (FLONASE) 50 MCG/ACT nasal spray Place 2 sprays into both nostrils daily.  06/05/17   Cathie Hoops, Amy V, PA-C  montelukast (SINGULAIR) 10 MG tablet Take 1 tablet (10 mg total) by mouth at bedtime. 04/02/15   Judi Saa, DO  triamcinolone (NASACORT) 55 MCG/ACT AERO nasal inhaler Place 2 sprays into the nose daily.    [provider]    Family History Family History  Problem Relation Age of Onset  . Diabetes Mother   . Cancer Other        Prostate Cancer-Grandfather  . Hyperlipidemia Neg Hx   . Hypertension Neg Hx   . Stroke Neg Hx     Social History Social History   Tobacco Use  . Smoking status: Former Smoker    Quit date: 03/14/2016    Years since quitting: 4.2  . Smokeless tobacco: Never Used  Substance Use Topics  . Alcohol use: No    Alcohol/week: 0.0 standard drinks  . Drug use: No    Types: Marijuana    Comment: stopped Summer 2018     Allergies   Patient has no known allergies.   Review of Systems Review of Systems  Constitutional: Negative for chills and fever.  HENT: Positive for sore throat. Negative for congestion and ear pain.   Eyes: Negative for pain and visual disturbance.  Respiratory: Positive for cough. Negative for shortness of breath.   Cardiovascular: Negative for chest pain and palpitations.  Gastrointestinal: Negative for abdominal pain, diarrhea and vomiting.  Genitourinary: Negative for dysuria and hematuria.  Musculoskeletal: Negative  for arthralgias and back pain.  Skin: Negative for color change and rash.  Neurological: Negative for seizures and syncope.  All other systems reviewed and are negative.    Physical Exam Triage Vital Signs ED Triage Vitals  Enc Vitals Group     BP      Pulse      Resp      Temp      Temp src      SpO2      Weight      Height      Head Circumference      Peak Flow      Pain Score      Pain Loc      Pain Edu?      Excl. in GC?    No data found.  Updated Vital Signs BP (!) 124/93 (BP Location: Right Arm)   Pulse 70   Temp 98.9 F (37.2 C) (Oral)   Resp  16   SpO2 100%   Visual Acuity Right Eye Distance:   Left Eye Distance:   Bilateral Distance:    Right Eye Near:   Left Eye Near:    Bilateral Near:     Physical Exam Vitals and nursing note reviewed.  Constitutional:      General: He is not in acute distress.    Appearance: He is well-developed.  HENT:     Head: Normocephalic and atraumatic.     Right Ear: Tympanic membrane normal.     Left Ear: Tympanic membrane normal.     Nose: Nose normal.     Mouth/Throat:     Mouth: Mucous membranes are moist.     Pharynx: Oropharyngeal exudate and posterior oropharyngeal erythema present.  Eyes:     Conjunctiva/sclera: Conjunctivae normal.  Cardiovascular:     Rate and Rhythm: Normal rate and regular rhythm.     Heart sounds: Normal heart sounds.  Pulmonary:     Effort: Pulmonary effort is normal. No respiratory distress.     Breath sounds: Normal breath sounds.  Abdominal:     Palpations: Abdomen is soft.     Tenderness: There is no abdominal tenderness. There is no guarding or rebound.  Musculoskeletal:     Cervical back: Neck supple.  Skin:    General: Skin is warm and dry.     Findings: No rash.  Neurological:     General: No focal deficit present.     Mental Status: He is alert and oriented to person, place, and time.     Gait: Gait normal.  Psychiatric:        Mood and Affect: Mood normal.        Behavior: Behavior normal.      UC Treatments / Results  Labs (all labs ordered are listed, but only abnormal results are displayed) Labs Reviewed  POCT URINALYSIS DIPSTICK, ED / UC - Abnormal; Notable for the following components:      Result Value   Bilirubin Urine SMALL (*)    Ketones, ur TRACE (*)    Hgb urine dipstick TRACE (*)    All other components within normal limits  SARS CORONAVIRUS 2 (TAT 6-24 HRS)  POCT RAPID STREP A, ED / UC    EKG   Radiology No results found.  Procedures Procedures (including critical care time)  Medications Ordered in  UC Medications - No data to display  Initial Impression / Assessment and Plan / UC Course  I have reviewed the triage vital  signs and the nursing notes.  Pertinent labs & imaging results that were available during my care of the patient were reviewed by me and considered in my medical decision making (see chart for details).   Sore throat, viral URI.  Rapid strep negative; culture pending.  PCR COVID pending.  Instructed patient to self quarantine until the test result is back.  Discussed symptomatic treatment including Tylenol, rest, hydration.  Instructed patient to follow up with PCP if his symptoms are not improving  Patient agrees to plan of care.    Final Clinical Impressions(s) / UC Diagnoses   Final diagnoses:  Sore throat  Viral upper respiratory tract infection     Discharge Instructions     Your rapid strep test is negative.  A throat culture is pending; we will call you if it is positive requiring treatment.    Your COVID test is pending.  You should self quarantine until the test result is back.    Take Tylenol as needed for fever or discomfort.  Rest and keep yourself hydrated.    Follow up with your primary care provider if your symptoms are not improving.           ED Prescriptions    None     PDMP not reviewed this encounter.   Mickie Bail, NP 05/29/20 1712

## 2020-05-30 LAB — POCT RAPID STREP A, ED / UC: Streptococcus, Group A Screen (Direct): NEGATIVE

## 2020-05-30 LAB — POCT URINALYSIS DIPSTICK, ED / UC
Nitrite: NEGATIVE
Protein, ur: NEGATIVE mg/dL
Specific Gravity, Urine: >=1.03 (ref 1.005–1.030)
Urobilinogen, UA: 0.2 mg/dL (ref 0.0–1.0)
pH: 5 (ref 5.0–8.0)

## 2020-05-30 LAB — CULTURE, GROUP A STREP (THRC)

## 2020-06-01 LAB — CULTURE, GROUP A STREP (THRC)

## 2020-06-15 ENCOUNTER — Encounter (HOSPITAL_COMMUNITY): Payer: Self-pay

## 2020-06-15 ENCOUNTER — Ambulatory Visit (HOSPITAL_COMMUNITY)
Admission: EM | Admit: 2020-06-15 | Discharge: 2020-06-15 | Disposition: A | Discharge status: Home / Self Care | Payer: Commercial Managed Care - PPO | Attending: Family Medicine | Admitting: Family Medicine

## 2020-06-15 ENCOUNTER — Other Ambulatory Visit: Payer: Self-pay

## 2020-06-15 DIAGNOSIS — J039 Acute tonsillitis, unspecified: Secondary | ICD-10-CM | POA: Diagnosis not present

## 2020-06-15 LAB — POCT INFECTIOUS MONO SCREEN, ED / UC: Mono Screen: NEGATIVE

## 2020-06-15 MED ORDER — AMOXICILLIN-POT CLAVULANATE 875-125 MG PO TABS: 1.0000 | ORAL_TABLET | Freq: Two times a day (BID) | ORAL | 0 refills | Status: AC

## 2020-06-15 NOTE — Discharge Instructions (Signed)
Negative for mono today as well which is reassuring.  Complete course of antibiotics.  Tylenol and/or ibuprofen as needed for pain or fevers.  If not improving or any worsening in the next week please return.

## 2020-06-15 NOTE — ED Provider Notes (Signed)
[facility]    CSN: [id number] Arrival date & time: 06/15/20  5573      History   Chief Complaint Chief Complaint  Patient presents with  . Sore Throat    HPI Ryan Adkins is a 24 y.o. male.   Ryan Adkins presents with complaints of worsening sore throat which started two week ago. Was seen here 11/14 with negative covid and strep testing at that time. He feels like he is since worse. Some right ear pain, right throat worse than left as well. States he woke up sweaty today, no specific known fevers, however Denies any previous similar. Over the counter medications haven't helped. Mild cough, worse when he wakes. No other nasal congestion. No known ill contacts.     ROS per HPI, negative if not otherwise mentioned.      Past Medical History:  Diagnosis Date  . ADHD (attention deficit hyperactivity disorder), combined type 05/12/2010   Qualifier: Diagnosis of  By: Beverely Low MD, Natalia Leatherwood    . Asthma   . Dysgraphia 11/30/2015    Patient Active Problem List   Diagnosis Date Noted  . Acute non-recurrent maxillary sinusitis 07/26/2018  . Biceps tendinitis of right shoulder 09/06/2016  . Cervical paraspinal muscle spasm 09/06/2016  . Dysgraphia 11/30/2015  . Dry skin 09/30/2014  . Asthma 09/30/2014  . ADHD (attention deficit hyperactivity disorder), combined type 05/12/2010  . ALLERGIC RHINITIS, SEASONAL 05/12/2010    History reviewed. No pertinent surgical history.     Home Medications    Prior to Admission medications   Medication Sig Start Date End Date Taking? Authorizing Provider  Albuterol Sulfate (PROAIR RESPICLICK) 108 (90 BASE) MCG/ACT AEPB Inhale 1-2 puffs into the lungs every 4 (four) hours as needed. 09/30/14   Veryl Speak, FNP  amoxicillin-clavulanate (AUGMENTIN) 875-125 MG tablet Take 1 tablet by mouth every 12 (twelve) hours for 10 days. 06/15/20 06/25/20  Georgetta Haber, NP  cetirizine-pseudoephedrine (ZYRTEC-D) 5-120 MG  tablet Take 1 tablet by mouth daily. 06/05/17   Yu, Amy V, PA-C  DAYTRANA 30 MG/9HR Place 1 patch onto the skin every morning. wear patch for 9 hours only each day 07/01/17   Lorina Rabon, NP  fluticasone (FLONASE) 50 MCG/ACT nasal spray Place 2 sprays into both nostrils daily. 06/05/17   Cathie Hoops, Amy V, PA-C  montelukast (SINGULAIR) 10 MG tablet Take 1 tablet (10 mg total) by mouth at bedtime. 04/02/15   Judi Saa, DO  triamcinolone (NASACORT) 55 MCG/ACT AERO nasal inhaler Place 2 sprays into the nose daily.    [provider]    Family History Family History  Problem Relation Age of Onset  . Diabetes Mother   . Cancer Other        Prostate Cancer-Grandfather  . Hyperlipidemia Neg Hx   . Hypertension Neg Hx   . Stroke Neg Hx     Social History Social History   Tobacco Use  . Smoking status: Former Smoker    Quit date: 03/14/2016    Years since quitting: 4.2  . Smokeless tobacco: Never Used  Substance Use Topics  . Alcohol use: No    Alcohol/week: 0.0 standard drinks  . Drug use: No    Types: Marijuana    Comment: stopped Summer 2018     Allergies   Patient has no known allergies.   Review of Systems Review of Systems   Physical Exam Triage Vital Signs ED Triage Vitals  Enc Vitals Group  BP 06/15/20 1128 127/86     Pulse Rate 06/15/20 1128 100     Resp 06/15/20 1128 18     Temp 06/15/20 1128 98.2 F (36.8 C)     Temp Source 06/15/20 1128 Oral     SpO2 06/15/20 1128 98 %     Weight --      Height --      Head Circumference --      Peak Flow --      Pain Score 06/15/20 1126 8     Pain Loc --      Pain Edu? --      Excl. in GC? --    No data found.  Updated Vital Signs BP 127/86 (BP Location: Right Arm)   Pulse 100   Temp 98.2 F (36.8 C) (Oral)   Resp 18   SpO2 98%   Visual Acuity Right Eye Distance:   Left Eye Distance:   Bilateral Distance:    Right Eye Near:   Left Eye Near:    Bilateral Near:     Physical  Exam Constitutional:      Appearance: He is well-developed.  HENT:     Right Ear: Tympanic membrane and ear canal normal.     Left Ear: Tympanic membrane and ear canal normal.     Mouth/Throat:     Mouth: Mucous membranes are moist.     Tonsils: Tonsillar exudate present. 2+ on the right. 2+ on the left.  Cardiovascular:     Rate and Rhythm: Normal rate.  Pulmonary:     Effort: Pulmonary effort is normal.  Lymphadenopathy:     Cervical: Cervical adenopathy present.  Skin:    General: Skin is warm and dry.  Neurological:     Mental Status: He is alert and oriented to person, place, and time.      UC Treatments / Results  Labs (all labs ordered are listed, but only abnormal results are displayed) Labs Reviewed  POCT INFECTIOUS MONO SCREEN, ED / UC    EKG   Radiology No results found.  Procedures Procedures (including critical care time)  Medications Ordered in UC Medications - No data to display  Initial Impression / Assessment and Plan / UC Course  I have reviewed the triage vital signs and the nursing notes.  Pertinent labs & imaging results that were available during my care of the patient were reviewed by me and considered in my medical decision making (see chart for details).     Negative rapid mono here today with worsening of tonsillitis on exam. No obvious deep space infection or abscess, with Augmentin initiated. Strict return precautions provided. Patient verbalized understanding and agreeable to plan.   Final Clinical Impressions(s) / UC Diagnoses   Final diagnoses:  Tonsillitis     Discharge Instructions     Negative for mono today as well which is reassuring.  Complete course of antibiotics.  Tylenol and/or ibuprofen as needed for pain or fevers.  If not improving or any worsening in the next week please return.     ED Prescriptions    Medication Sig Dispense Auth. Provider   amoxicillin-clavulanate (AUGMENTIN) 875-125 MG tablet Take 1  tablet by mouth every 12 (twelve) hours for 10 days. 20 tablet Georgetta Haber, NP     PDMP not reviewed this encounter.   Georgetta Haber, NP 06/15/20 1218

## 2020-06-15 NOTE — ED Triage Notes (Signed)
Pt presents with ongoing sore throat & inflammation X 2 weeks; pt had negative strep and covid 2 weeks ago at onset.

## 2020-06-20 ENCOUNTER — Ambulatory Visit: Payer: Commercial Managed Care - PPO | Admitting: Internal Medicine

## 2020-07-01 ENCOUNTER — Ambulatory Visit (HOSPITAL_COMMUNITY)
Admission: EM | Admit: 2020-07-01 | Discharge: 2020-07-01 | Disposition: A | Discharge status: Home / Self Care | Payer: Commercial Managed Care - PPO

## 2020-07-01 ENCOUNTER — Emergency Department (HOSPITAL_COMMUNITY): Payer: Commercial Managed Care - PPO

## 2020-07-01 ENCOUNTER — Emergency Department (HOSPITAL_COMMUNITY)
Admission: EM | Admit: 2020-07-01 | Discharge: 2020-07-02 | Disposition: A | Discharge status: Home / Self Care | Payer: Commercial Managed Care - PPO | Attending: Emergency Medicine | Admitting: Emergency Medicine

## 2020-07-01 ENCOUNTER — Encounter (HOSPITAL_COMMUNITY): Payer: Self-pay | Admitting: Emergency Medicine

## 2020-07-01 ENCOUNTER — Other Ambulatory Visit: Payer: Self-pay

## 2020-07-01 DIAGNOSIS — R042 Hemoptysis: Secondary | ICD-10-CM | POA: Insufficient documentation

## 2020-07-01 DIAGNOSIS — Z7952 Long term (current) use of systemic steroids: Secondary | ICD-10-CM | POA: Insufficient documentation

## 2020-07-01 DIAGNOSIS — J45909 Unspecified asthma, uncomplicated: Secondary | ICD-10-CM | POA: Insufficient documentation

## 2020-07-01 DIAGNOSIS — Z87891 Personal history of nicotine dependence: Secondary | ICD-10-CM | POA: Insufficient documentation

## 2020-07-01 DIAGNOSIS — R197 Diarrhea, unspecified: Secondary | ICD-10-CM

## 2020-07-01 DIAGNOSIS — J36 Peritonsillar abscess: Secondary | ICD-10-CM | POA: Diagnosis present

## 2020-07-01 DIAGNOSIS — R109 Unspecified abdominal pain: Secondary | ICD-10-CM | POA: Diagnosis not present

## 2020-07-01 DIAGNOSIS — J039 Acute tonsillitis, unspecified: Secondary | ICD-10-CM | POA: Diagnosis not present

## 2020-07-01 LAB — CBC
HCT: 46.8 % (ref 39.0–52.0)
Hemoglobin: 15.5 g/dL (ref 13.0–17.0)
MCH: 28.7 pg (ref 26.0–34.0)
MCHC: 33.1 g/dL (ref 30.0–36.0)
MCV: 86.7 fL (ref 80.0–100.0)
Platelets: 227 10*3/uL (ref 150–400)
RBC: 5.4 MIL/uL (ref 4.22–5.81)
RDW: 12.9 % (ref 11.5–15.5)
WBC: 16 10*3/uL — ABNORMAL HIGH (ref 4.0–10.5)
nRBC: 0 % (ref 0.0–0.2)

## 2020-07-01 LAB — COMPREHENSIVE METABOLIC PANEL
ALT: 34 U/L (ref 0–44)
AST: 19 U/L (ref 15–41)
Albumin: 3.9 g/dL (ref 3.5–5.0)
Alkaline Phosphatase: 57 U/L (ref 38–126)
Anion gap: 11 (ref 5–15)
BUN: 10 mg/dL (ref 6–20)
CO2: 24 mmol/L (ref 22–32)
Calcium: 9.2 mg/dL (ref 8.9–10.3)
Chloride: 102 mmol/L (ref 98–111)
Creatinine, Ser: 1.15 mg/dL (ref 0.61–1.24)
GFR, Estimated: >60 mL/min (ref >60)
Glucose, Bld: 103 mg/dL — ABNORMAL HIGH (ref 70–99)
Potassium: 3.6 mmol/L (ref 3.5–5.1)
Sodium: 137 mmol/L (ref 135–145)
Total Bilirubin: 0.9 mg/dL (ref 0.3–1.2)
Total Protein: 7.5 g/dL (ref 6.5–8.1)

## 2020-07-01 LAB — URINALYSIS, ROUTINE W REFLEX MICROSCOPIC
Bilirubin Urine: NEGATIVE
Glucose, UA: NEGATIVE mg/dL
Hgb urine dipstick: NEGATIVE
Ketones, ur: 5 mg/dL — AB
Leukocytes,Ua: NEGATIVE
Nitrite: NEGATIVE
Protein, ur: NEGATIVE mg/dL
Specific Gravity, Urine: 1.021 (ref 1.005–1.030)
pH: 5 (ref 5.0–8.0)

## 2020-07-01 LAB — LIPASE, BLOOD: Lipase: 26 U/L (ref 11–51)

## 2020-07-01 MED ORDER — AMOXICILLIN-POT CLAVULANATE 875-125 MG PO TABS: 1.0000 | ORAL_TABLET | Freq: Two times a day (BID) | ORAL | 0 refills | Status: AC

## 2020-07-01 MED ORDER — KETOROLAC TROMETHAMINE 15 MG/ML IJ SOLN
15.0000 mg | Freq: Once | INTRAMUSCULAR | Status: DC
  Filled 2020-07-01: qty 1

## 2020-07-01 MED ORDER — IOHEXOL 300 MG/ML  SOLN
75.0000 mL | Freq: Once | INTRAMUSCULAR | Status: AC | PRN
  Administered 2020-07-01: 75 mL via INTRAVENOUS

## 2020-07-01 MED ORDER — SODIUM CHLORIDE 0.9 % IV SOLN
3.0000 g | Freq: Four times a day (QID) | INTRAVENOUS | Status: DC
  Administered 2020-07-01: 23:00:00 3 g via INTRAVENOUS
  Filled 2020-07-01 (×3): qty 8

## 2020-07-01 MED ORDER — SODIUM CHLORIDE 0.9 % IV SOLN: Freq: Once | INTRAVENOUS | Status: DC

## 2020-07-01 MED ORDER — KETOROLAC TROMETHAMINE 15 MG/ML IJ SOLN
15.0000 mg | Freq: Once | INTRAMUSCULAR | Status: AC
  Administered 2020-07-01: 23:00:00 15 mg via INTRAVENOUS
  Filled 2020-07-01: qty 1

## 2020-07-01 MED ORDER — ONDANSETRON HCL 4 MG/2ML IJ SOLN
4.0000 mg | Freq: Once | INTRAMUSCULAR | Status: AC
  Administered 2020-07-01: 23:00:00 4 mg via INTRAVENOUS
  Filled 2020-07-01: qty 2

## 2020-07-01 NOTE — ED Provider Notes (Signed)
[facility] EMERGENCY DEPARTMENT Provider Note   CSN: [id number] Arrival date & time: 07/01/20  1007     History Chief Complaint  Patient presents with   Hemoptysis    Ryan Adkins is a 24 y.o. male w/ PMHx of recurrent tonsillitis presents to ED from [facility] w/ chief complaint of hemoptysis.  He endorses spitting up some blood in mucus due to continued throat pain. Recently treated for tonsillitis 12/1 with augmentin and was seen for throat pain 11/4. He has associated symptoms of fever, decrease oral intake x2 days, and diarrhea. Also has pain with swallowing and right sided neck pain. Endorses headache due congestion.  Has history of asthma and seasonal allergies but has not taken medication due to not having them and unable to set up virtual appt with PCP. Last known medications today were dayquil and an allergy pill.   He denies vomiting, chest pain, wheezing, SOB. Has diffuse abdominal discomfort.     Past Medical History:  Diagnosis Date   ADHD (attention deficit hyperactivity disorder), combined type 05/12/2010   Qualifier: Diagnosis of  By: Beverely Low MD, Katherine     Asthma    Dysgraphia 11/30/2015    Patient Active Problem List   Diagnosis Date Noted   Acute non-recurrent maxillary sinusitis 07/26/2018   Biceps tendinitis of right shoulder 09/06/2016   Cervical paraspinal muscle spasm 09/06/2016   Dysgraphia 11/30/2015   Dry skin 09/30/2014   Asthma 09/30/2014   ADHD (attention deficit hyperactivity disorder), combined type 05/12/2010   ALLERGIC RHINITIS, SEASONAL 05/12/2010    No past surgical history on file.     Family History  Problem Relation Age of Onset   Diabetes Mother    Cancer Other        Prostate Cancer-Grandfather   Hyperlipidemia Neg Hx    Hypertension Neg Hx    Stroke Neg Hx     Social History   Tobacco Use   Smoking status: Former Smoker    Quit date: 03/14/2016    Years since quitting: 4.3    Smokeless tobacco: Never Used  Substance Use Topics   Alcohol use: No    Alcohol/week: 0.0 standard drinks   Drug use: No    Types: Marijuana    Comment: stopped Summer 2018    Home Medications Prior to Admission medications   Medication Sig Start Date End Date Taking? Authorizing Provider  Albuterol Sulfate (PROAIR RESPICLICK) 108 (90 BASE) MCG/ACT AEPB Inhale 1-2 puffs into the lungs every 4 (four) hours as needed. 09/30/14   Veryl Speak, FNP  cetirizine-pseudoephedrine (ZYRTEC-D) 5-120 MG tablet Take 1 tablet by mouth daily. 06/05/17   Yu, Amy V, PA-C  DAYTRANA 30 MG/9HR Place 1 patch onto the skin every morning. wear patch for 9 hours only each day 07/01/17   Lorina Rabon, NP  fexofenadine (ALLEGRA) 180 MG tablet Take 180 mg by mouth daily.    [provider]  fluticasone (FLONASE) 50 MCG/ACT nasal spray Place 2 sprays into both nostrils daily. 06/05/17   Cathie Hoops, Amy V, PA-C  montelukast (SINGULAIR) 10 MG tablet Take 1 tablet (10 mg total) by mouth at bedtime. 04/02/15   Judi Saa, DO  triamcinolone (NASACORT) 55 MCG/ACT AERO nasal inhaler Place 2 sprays into the nose daily.    [provider]    Allergies    Patient has no known allergies.  Review of Systems   Review of Systems  Constitutional: Positive for appetite change, fatigue and  fever.  HENT: Positive for congestion, drooling, ear pain, postnasal drip, sinus pressure, sore throat, trouble swallowing and voice change.   Eyes: Negative for visual disturbance.  Respiratory: Negative for cough, chest tightness, shortness of breath and wheezing.   Cardiovascular: Negative for chest pain.  Gastrointestinal: Positive for abdominal pain and diarrhea. Negative for blood in stool and vomiting.  Genitourinary: Negative for difficulty urinating.  Musculoskeletal: Positive for neck pain. Negative for arthralgias.  Skin: Negative for rash.  Neurological: Positive for headaches.  Hematological: Positive  for adenopathy.  Psychiatric/Behavioral: Positive for sleep disturbance.    Physical Exam Updated Vital Signs BP 128/65 (BP Location: Right Arm)    Pulse (!) 107    Temp (!) 100.4 F (38 C) (Oral)    Resp 16    Ht 5\' 11"  (1.803 m)    Wt 108.9 kg    SpO2 99%    BMI 33.47 kg/m   Physical Exam Constitutional:      Appearance: He is ill-appearing. He is not toxic-appearing.  HENT:     Head: Normocephalic.     Right Ear: External ear normal.     Left Ear: External ear normal.     Nose: Nose normal.     Mouth/Throat:     Mouth: Mucous membranes are moist. No oral lesions.     Pharynx: Pharyngeal swelling and posterior oropharyngeal erythema present. No oropharyngeal exudate or uvula swelling.     Tonsils: No tonsillar exudate.     Comments: Bilateral enlarged erythematous tonsils  Neck:     Thyroid: No thyroid mass.  Cardiovascular:     Rate and Rhythm: Normal rate and regular rhythm.     Pulses: Normal pulses.     Heart sounds: Normal heart sounds.  Pulmonary:     Effort: Pulmonary effort is normal.     Breath sounds: Normal breath sounds.  Abdominal:     General: Abdomen is flat. Bowel sounds are normal.     Palpations: There is no mass.     Tenderness: There is abdominal tenderness. There is no guarding or rebound.     Comments: Diffuse abdominal tenderness to palpation  Musculoskeletal:     Cervical back: Normal range of motion. Edema present. Pain with movement and muscular tenderness present.  Skin:    General: Skin is warm and dry.     Findings: No rash.  Neurological:     Mental Status: He is alert and oriented to person, place, and time. Mental status is at baseline.     Motor: No weakness.  Psychiatric:        Mood and Affect: Mood normal.        Behavior: Behavior normal.    ED Results / Procedures / Treatments   Labs (all labs ordered are listed, but only abnormal results are displayed) Labs Reviewed  COMPREHENSIVE METABOLIC PANEL - Abnormal; Notable for  the following components:      Result Value   Glucose, Bld 103 (*)    All other components within normal limits  CBC - Abnormal; Notable for the following components:   WBC 16.0 (*)    All other components within normal limits  URINALYSIS, ROUTINE W REFLEX MICROSCOPIC - Abnormal; Notable for the following components:   Ketones, ur 5 (*)    All other components within normal limits  GROUP A STREP BY PCR  LIPASE, BLOOD   Radiology CT Soft Tissue Neck W Contrast  Result Date: 07/01/2020 CLINICAL DATA:  Right neck swelling  EXAM: CT NECK WITH CONTRAST TECHNIQUE: Multidetector CT imaging of the neck was performed using the standard protocol following the bolus administration of intravenous contrast. CONTRAST:  23mL OMNIPAQUE IOHEXOL 300 MG/ML  SOLN COMPARISON:  None. FINDINGS: PHARYNX AND LARYNX: There is a right peritonsillar fluid collection measuring 1.1 x 1.5 cm. Moderate adenoid and palatine tonsillar enlargement. Normal retropharyngeal space. SALIVARY GLANDS: Mildly enlarged right submandibular gland. Other salivary glands are normal. THYROID: Normal. LYMPH NODES: Mildly enlarged bilateral level 2A lymph nodes. VASCULAR: Major cervical vessels are patent. LIMITED INTRACRANIAL: Normal. VISUALIZED ORBITS: Normal. MASTOIDS AND VISUALIZED PARANASAL SINUSES: No fluid levels or advanced mucosal thickening. No mastoid effusion. SKELETON: No bony spinal canal stenosis. No lytic or blastic lesions. UPPER CHEST: Clear. OTHER: None. IMPRESSION: 1. Right peritonsillar abscess measuring 1.1 x 1.5 cm. 2. Moderate adenoid and palatine tonsillar enlargement, consistent with acute tonsillopharyngitis. 3. Mildly enlarged right submandibular gland and bilateral level 2A lymph nodes, likely reactive. Electronically Signed   By: Deatra Robinson M.D.   On: 07/01/2020 22:37   Procedures Ultrasound ED Peripheral IV (Provider)  Date/Time: 07/01/2020 10:08 PM Performed by: Corliss Blacker, MD Authorized by: Blane Ohara, MD   Procedure details:    Indications: hydration and multiple failed IV attempts     Skin Prep: chlorhexidine gluconate     Location:  Left anterior forearm   Angiocath:  18 G   Bedside Ultrasound Guided: Yes     Images: not archived     Patient tolerated procedure without complications: Yes     Dressing applied: Yes   .Marland KitchenIncision and Drainage  Date/Time: 07/01/2020 11:59 PM Performed by: Lavonda Jumbo, DO Authorized by: Blane Ohara, MD   Consent:    Consent obtained:  Verbal   Consent given by:  Patient   Risks, benefits, and alternatives were discussed: yes     Risks discussed:  Bleeding, incomplete drainage, pain, infection and damage to other organs   Alternatives discussed:  Observation and referral Universal protocol:    Procedure explained and questions answered to patient or proxy's satisfaction: yes     Imaging studies available: yes     Immediately prior to procedure, a time out was called: yes     Patient identity confirmed:  Arm band and verbally with patient Location:    Type:  Abscess   Size:  1x1cm   Location:  Mouth   Mouth location:  Peritonsillar Anesthesia:    Anesthesia method:  Topical application   Topical anesthesia: hurricaine spray. Procedure type:    Complexity:  Simple Procedure details:    Ultrasound guidance: no     Needle aspiration: yes     Needle size:  18 G   Incision types:  Single straight and stab incision   Incision depth:  Subcutaneous   Drainage:  Bloody and purulent   Drainage amount:  Scant   Packing materials:  None Post-procedure details:    Procedure completion:  Tolerated well, no immediate complications   (20 mins)  Medications Ordered in ED Medications  0.9 %  sodium chloride infusion (0 mLs Intravenous Hold 07/01/20 2122)  Ampicillin-Sulbactam (UNASYN) 3 g in sodium chloride 0.9 % 100 mL IVPB (3 g Intravenous New Bag/Given 07/01/20 2329)  ketorolac (TORADOL) 15 MG/ML injection 15 mg (15 mg  Intravenous Given 07/01/20 2253)  iohexol (OMNIPAQUE) 300 MG/ML solution 75 mL (75 mLs Intravenous Contrast Given 07/01/20 2226)  ondansetron (ZOFRAN) injection 4 mg (4 mg Intravenous Given 07/01/20 2306)   ED Course  I  have reviewed the triage vital signs and the nursing notes.  Pertinent labs & imaging results that were available during my care of the patient were reviewed by me and considered in my medical decision making (see chart for details).  WBC w/ elevated white count CT neck consistent with right peritonsillar abscess and reactive lymph nodes   MDM Rules/Calculators/A&P                         Ryan Adkins is a 24 y.o. male w/ right peritonsillar abscess.   He is febrile but otherwise stable. Symptoms of sore throat, voice change, increase secretion, pain with swallowing. CT neck soft tissue image significant for right peritonsillar abscess and reactive lymph nodes. IVF 500 cc bolus x1.  IV toradol 15 mg x1 ordered for throat and neck pain. Started Unasyn.   1140 pm- Peritonsillar I&D as above. <1cc of bloody purulent aspirate expressed.   1150 pm- Patient stable for discharge after completion of antibiotic.  Discussed discharge with continuation of oral antibiotics and follow up with ENT. Patient and patient's mother agree with plan.   Final Clinical Impression(s) / ED Diagnoses Final diagnoses:  Peritonsillar abscess    Rx / DC Orders ED Discharge Orders         Ordered    amoxicillin-clavulanate (AUGMENTIN) 875-125 MG tablet  2 times daily        07/01/20 2349           Autry-Lott, [place], DO 07/02/20 0007    Blane Ohara, MD 07/02/20 0013

## 2020-07-01 NOTE — ED Triage Notes (Signed)
Pt here with reports of coughing up blood, sore throat and occasional abd pain. Pt also with sore throat, dx with strep with minimal improvement after abx.

## 2020-07-01 NOTE — ED Provider Notes (Signed)
[facility]    CSN: [id number] Arrival date & time: 07/01/20  0844      History   Chief Complaint Chief Complaint  Patient presents with   Sore Throat    Dx with strep 12/1   Diarrhea    Since abx completion     HPI Ryan Adkins is a 24 y.o. male.   Accompanied by his mother, patient presents with "spitting up blood", sore throat, ear pain, diarrhea, malaise, low-grade fever x2 days.  He was seen here on 06/15/2020; diagnosed with tonsillitis; treated with Augmentin.  He states his symptoms resolved but then returned.  He denies rash, cough, shortness of breath, or other symptoms.  History includes asthma, ADHD, seasonal allergies.  The history is provided by the patient and a parent.    Past Medical History:  Diagnosis Date   ADHD (attention deficit hyperactivity disorder), combined type 05/12/2010   Qualifier: Diagnosis of  By: Beverely Low MD, Katherine     Asthma    Dysgraphia 11/30/2015    Patient Active Problem List   Diagnosis Date Noted   Acute non-recurrent maxillary sinusitis 07/26/2018   Biceps tendinitis of right shoulder 09/06/2016   Cervical paraspinal muscle spasm 09/06/2016   Dysgraphia 11/30/2015   Dry skin 09/30/2014   Asthma 09/30/2014   ADHD (attention deficit hyperactivity disorder), combined type 05/12/2010   ALLERGIC RHINITIS, SEASONAL 05/12/2010    History reviewed. No pertinent surgical history.     Home Medications    Prior to Admission medications   Medication Sig Start Date End Date Taking? Authorizing Provider  fexofenadine (ALLEGRA) 180 MG tablet Take 180 mg by mouth daily.   Yes [provider]  Albuterol Sulfate (PROAIR RESPICLICK) 108 (90 BASE) MCG/ACT AEPB Inhale 1-2 puffs into the lungs every 4 (four) hours as needed. 09/30/14   Veryl Speak, FNP  cetirizine-pseudoephedrine (ZYRTEC-D) 5-120 MG tablet Take 1 tablet by mouth daily. 06/05/17   Yu, Amy V, PA-C  DAYTRANA 30 MG/9HR Place 1  patch onto the skin every morning. wear patch for 9 hours only each day 07/01/17   Lorina Rabon, NP  fluticasone (FLONASE) 50 MCG/ACT nasal spray Place 2 sprays into both nostrils daily. 06/05/17   Cathie Hoops, Amy V, PA-C  montelukast (SINGULAIR) 10 MG tablet Take 1 tablet (10 mg total) by mouth at bedtime. 04/02/15   Judi Saa, DO  triamcinolone (NASACORT) 55 MCG/ACT AERO nasal inhaler Place 2 sprays into the nose daily.    [provider]    Family History Family History  Problem Relation Age of Onset   Diabetes Mother    Cancer Other        Prostate Cancer-Grandfather   Hyperlipidemia Neg Hx    Hypertension Neg Hx    Stroke Neg Hx     Social History Social History   Tobacco Use   Smoking status: Former Smoker    Quit date: 03/14/2016    Years since quitting: 4.3   Smokeless tobacco: Never Used  Substance Use Topics   Alcohol use: No    Alcohol/week: 0.0 standard drinks   Drug use: No    Types: Marijuana    Comment: stopped Summer 2018     Allergies   Patient has no known allergies.   Review of Systems Review of Systems  Constitutional: Negative for chills and fever.  HENT: Positive for ear pain and sore throat.   Eyes: Negative for pain and visual disturbance.  Respiratory: Negative for cough  and shortness of breath.   Cardiovascular: Negative for chest pain and palpitations.  Gastrointestinal: Positive for diarrhea, nausea and vomiting. Negative for abdominal pain.  Genitourinary: Negative for dysuria and hematuria.  Musculoskeletal: Negative for arthralgias and back pain.  Skin: Negative for color change and rash.  Neurological: Negative for seizures and syncope.  All other systems reviewed and are negative.    Physical Exam Triage Vital Signs ED Triage Vitals  Enc Vitals Group     BP      Pulse      Resp      Temp      Temp src      SpO2      Weight      Height      Head Circumference      Peak Flow      Pain Score      Pain  Loc      Pain Edu?      Excl. in GC?    No data found.  Updated Vital Signs BP 119/66    Pulse (!) 104    Temp 99.6 F (37.6 C) (Oral)    Resp 16    SpO2 97%   Visual Acuity Right Eye Distance:   Left Eye Distance:   Bilateral Distance:    Right Eye Near:   Left Eye Near:    Bilateral Near:     Physical Exam Vitals and nursing note reviewed.  Constitutional:      General: He is not in acute distress.    Appearance: He is well-developed and well-nourished. He is ill-appearing.  HENT:     Head: Normocephalic and atraumatic.     Right Ear: Tympanic membrane normal.     Left Ear: Tympanic membrane normal.     Nose: Nose normal.     Mouth/Throat:     Mouth: Mucous membranes are moist.     Pharynx: Posterior oropharyngeal erythema present.     Tonsils: 3+ on the right. 2+ on the left.  Eyes:     Conjunctiva/sclera: Conjunctivae normal.  Cardiovascular:     Rate and Rhythm: Normal rate and regular rhythm.     Heart sounds: Normal heart sounds.  Pulmonary:     Effort: Pulmonary effort is normal. No respiratory distress.     Breath sounds: Normal breath sounds.  Abdominal:     Palpations: Abdomen is soft.     Tenderness: There is no abdominal tenderness.  Musculoskeletal:        General: No edema.     Cervical back: Neck supple.  Skin:    General: Skin is warm and dry.  Neurological:     General: No focal deficit present.     Mental Status: He is alert and oriented to person, place, and time.     Gait: Gait normal.  Psychiatric:        Mood and Affect: Mood and affect and mood normal.        Behavior: Behavior normal.      UC Treatments / Results  Labs (all labs ordered are listed, but only abnormal results are displayed) Labs Reviewed - No data to display  EKG   Radiology No results found.  Procedures Procedures (including critical care time)  Medications Ordered in UC Medications - No data to display  Initial Impression / Assessment and Plan / UC  Course  I have reviewed the triage vital signs and the nursing notes.  Pertinent labs & imaging results that  were available during my care of the patient were reviewed by me and considered in my medical decision making (see chart for details).   Hemoptysis, acute tonsillitis, diarrhea.  Discussed treatment options; patient opts to go to the ED for additional evaluation.  His mother will drive him there.   Final Clinical Impressions(s) / UC Diagnoses   Final diagnoses:  Acute tonsillitis, unspecified etiology  Diarrhea, unspecified type  Hemoptysis     Discharge Instructions     Go to the Emergency Department for evaluation of your spitting up blood and other symptoms.        ED Prescriptions    None     PDMP not reviewed this encounter.   Mickie Bail, NP 07/01/20 [phone]

## 2020-07-01 NOTE — ED Triage Notes (Signed)
Diagnosed with strep dec. 1, took full course of abx.  Sore throat and "spitting up blood" started yesterday.  Bilateral ear pain started today as well.  Diarrhea since Dec. 1

## 2020-07-01 NOTE — Discharge Instructions (Addendum)
We were able to drain the abscess please continue antibiotics for 7 days. Call to make an appointment with ENT on Monday.   You may alternate Tylenol 1000 mg every 6 hours as needed for pain, fever and Ibuprofen 800 mg every 8 hours as needed for pain, fever.  Please take Ibuprofen with food.  Do not take more than 4000 mg of Tylenol (acetaminophen) in a 24 hour period.

## 2020-07-01 NOTE — Discharge Instructions (Addendum)
Go to the Emergency Department for evaluation of your spitting up blood and other symptoms.

## 2020-07-02 LAB — GROUP A STREP BY PCR: Group A Strep by PCR: NOT DETECTED

## 2020-07-02 NOTE — ED Notes (Signed)
E-signature pad unavailable at time of pt discharge. This RN discussed discharge materials with pt and answered all pt questions. Pt stated understanding of discharge material. ? ?

## 2022-03-16 IMAGING — CT CT NECK W/ CM
4 of 6 series · 12 of 35 positions shown, 14 images · IV contrast (APPLIED)
Comparison: None.

CLINICAL DATA: Right neck swelling

EXAM:
CT NECK WITH CONTRAST
TECHNIQUE: Multidetector CT imaging of the neck was performed using the
standard protocol following the bolus administration of intravenous
contrast.
CONTRAST:  75mL OMNIPAQUE IOHEXOL 300 MG/ML  SOLN

[Series 3: axial neck · axial · 0.66mm/px · z∈[-78,-4]mm · 2 of 112 slices shown, 3 images]
[im 38/112  soft-tissue]
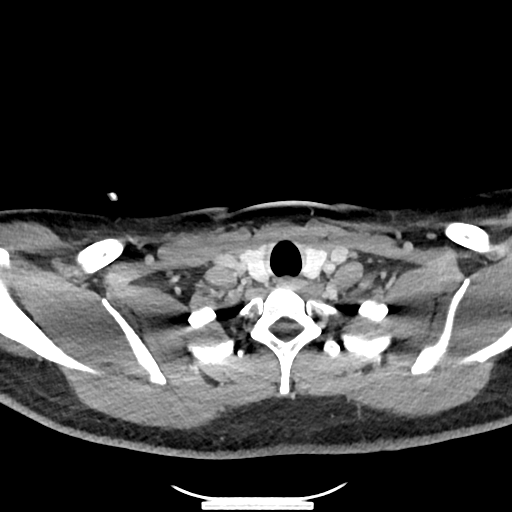
[im 38/112  bone]
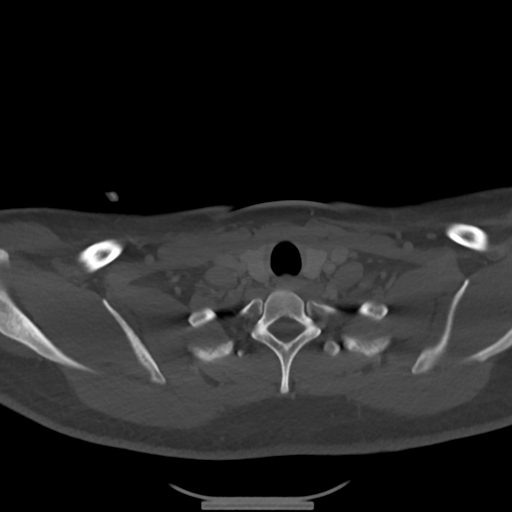
[im 75/112  bone]
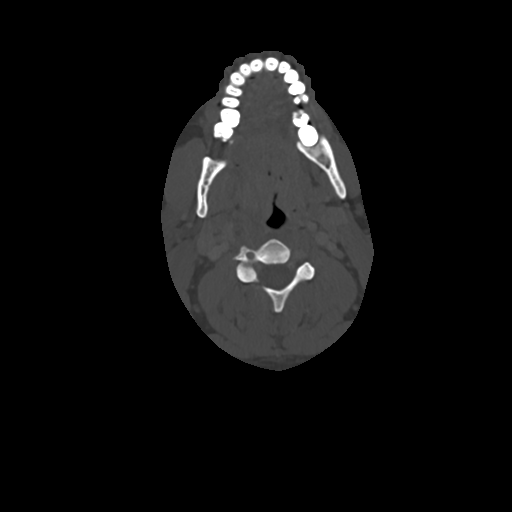

[Series 6: sag neck · sagittal · 0.47mm/px · 5 of 139 slices shown, 6 images]
[im 47/139  bone]
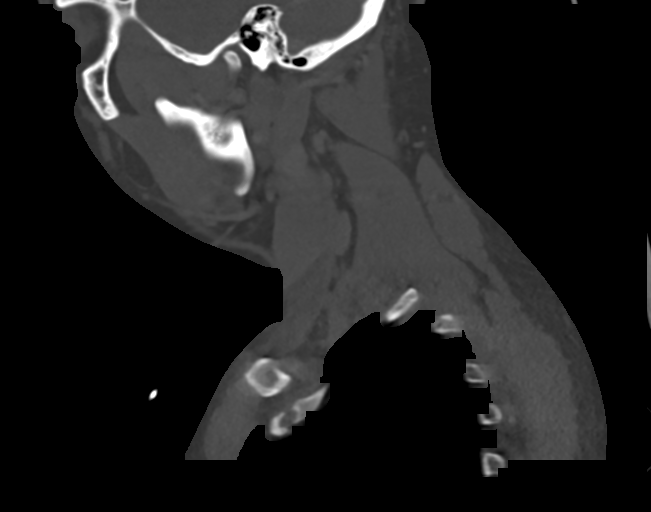
[im 58/139  bone]
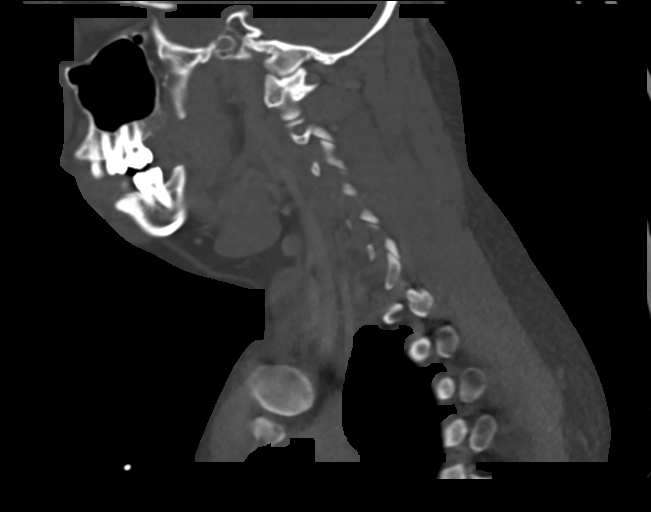
[im 70/139  soft-tissue]
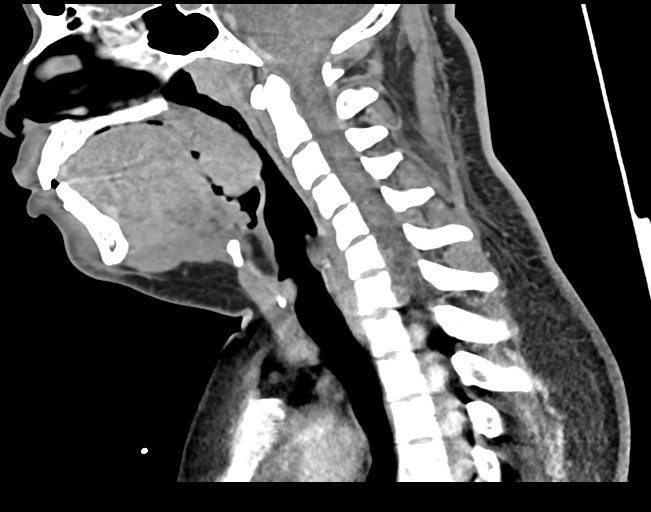
[im 70/139  bone]
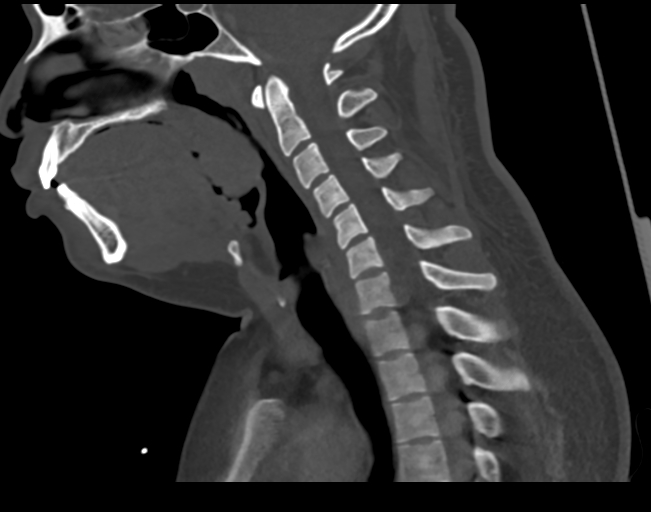
[im 81/139  bone]
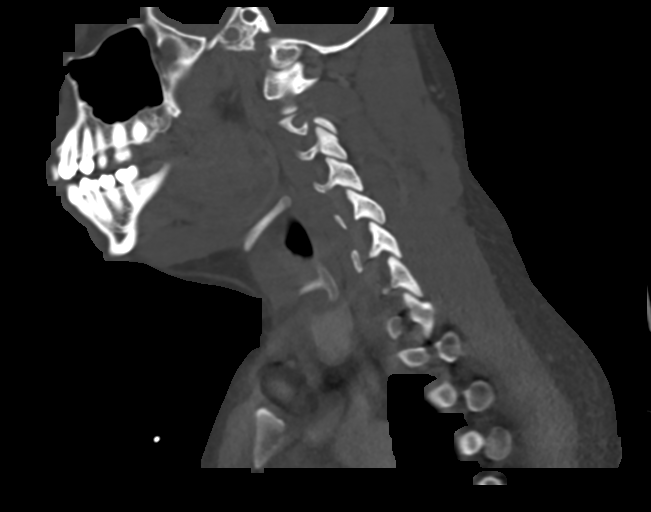
[im 93/139  bone]
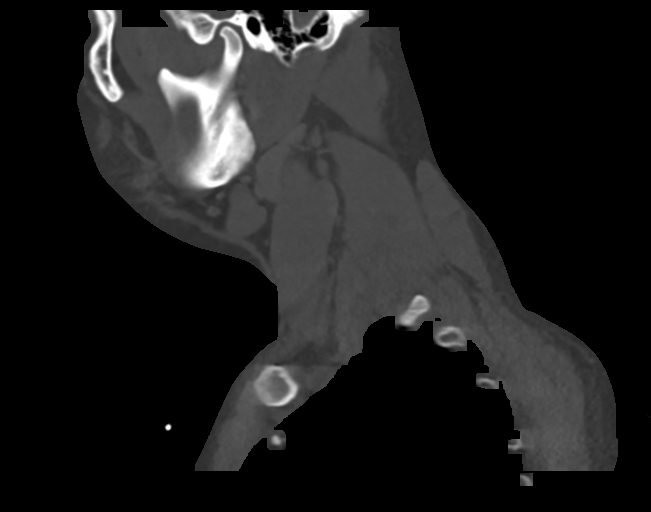

[Series 7: cor neck · coronal · 0.47mm/px · 3 of 153 slices shown]
[im 31/153  bone]
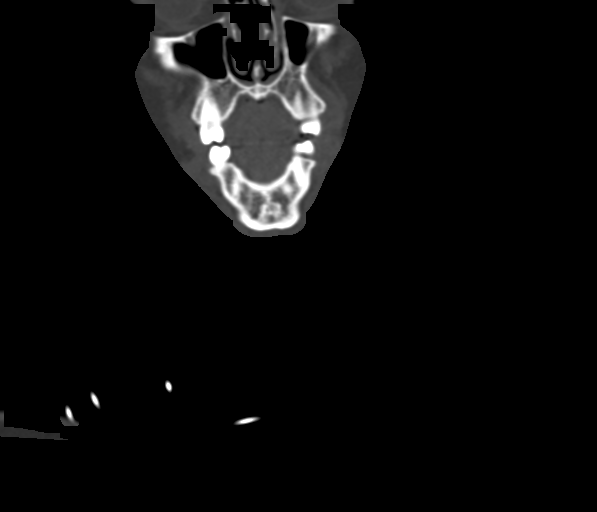
[im 61/153  bone]
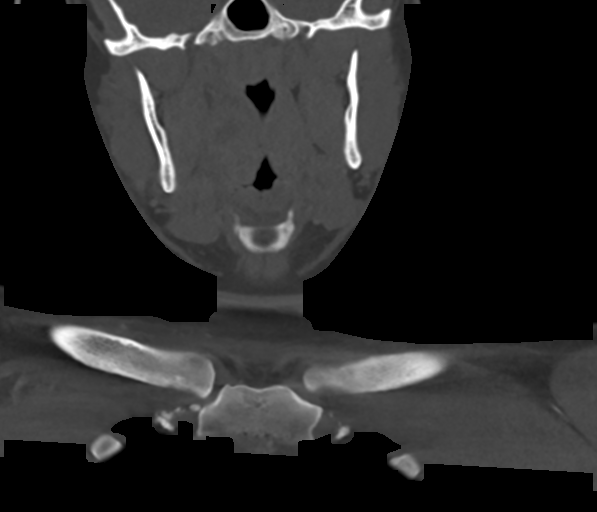
[im 92/153  bone]
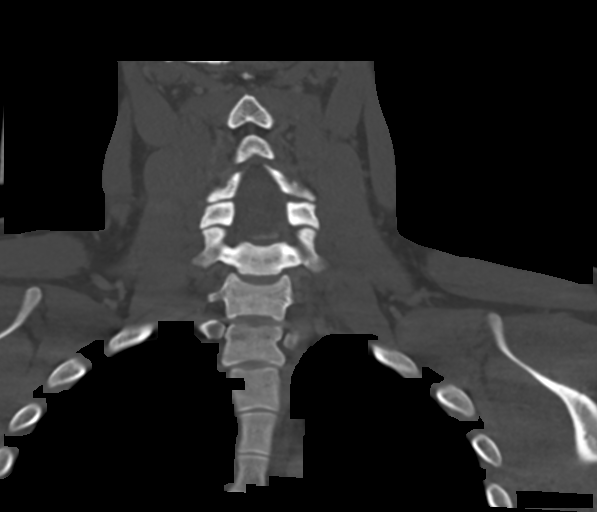

[Series 8: ax oropharynx · axial · 0.59mm/px · z∈[-96,-17]mm · 2 of 120 slices shown]
[im 40/120  bone]
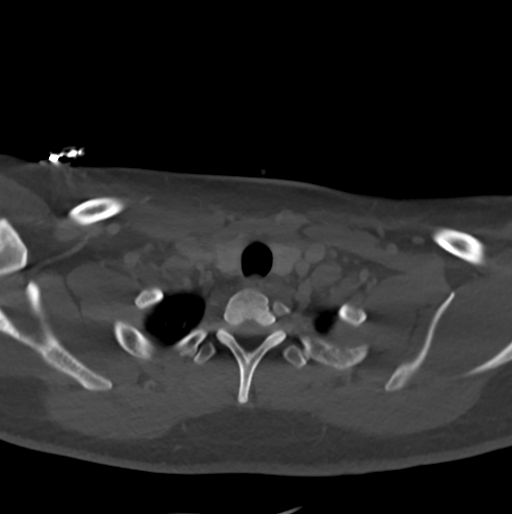
[im 80/120  bone]
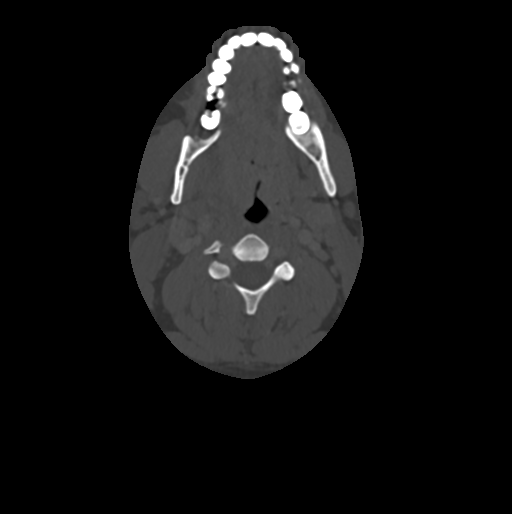

[12 of 35 positions shown; findings below may reference images not displayed]

FINDINGS: PHARYNX AND LARYNX: There is a right peritonsillar fluid collection
measuring 1.1 x 1.5 cm. Moderate adenoid and palatine tonsillar
enlargement. Normal retropharyngeal space.

SALIVARY GLANDS: Mildly enlarged right submandibular gland. Other
salivary glands are normal.

THYROID: Normal.

LYMPH NODES: Mildly enlarged bilateral level 2A lymph nodes.

VASCULAR: Major cervical vessels are patent.

LIMITED INTRACRANIAL: Normal.

VISUALIZED ORBITS: Normal.

MASTOIDS AND VISUALIZED PARANASAL SINUSES: No fluid levels or
advanced mucosal thickening. No mastoid effusion.

SKELETON: No bony spinal canal stenosis. No lytic or blastic
lesions.

UPPER CHEST: Clear.

OTHER: None.
IMPRESSION: 1. Right peritonsillar abscess measuring 1.1 x 1.5 cm.
2. Moderate adenoid and palatine tonsillar enlargement, consistent
with acute tonsillopharyngitis.
3. Mildly enlarged right submandibular gland and bilateral level 2A
lymph nodes, likely reactive.

## 2022-09-28 ENCOUNTER — Ambulatory Visit (INDEPENDENT_AMBULATORY_CARE_PROVIDER_SITE_OTHER): Payer: Commercial Managed Care - PPO | Admitting: Podiatry

## 2022-09-28 DIAGNOSIS — B351 Tinea unguium: Secondary | ICD-10-CM

## 2022-09-28 MED ORDER — TERBINAFINE HCL 250 MG PO TABS: 250.0000 mg | ORAL_TABLET | Freq: Every day | ORAL | 0 refills | Status: DC

## 2022-09-28 MED ORDER — CLOTRIMAZOLE-BETAMETHASONE 1-0.05 % EX CREA: 1.0000 | TOPICAL_CREAM | Freq: Two times a day (BID) | CUTANEOUS | 1 refills | Status: DC

## 2022-09-28 NOTE — Progress Notes (Signed)
   Chief Complaint  Patient presents with   Nail Problem    Patient came in today for bilateral hallux total nail removal, patient has thick yellow nail, left hallux had an injury,     Subjective: 27 y.o. male presenting today for evaluation of thickening with discoloration to the bilateral great toenails and right second toe.  Gradual onset over the past few years.  He is concerned for possible toenail fungus.  Presenting for further treatment and evaluation  Past Medical History:  Diagnosis Date   ADHD (attention deficit hyperactivity disorder), combined type 05/12/2010   Qualifier: Diagnosis of  By: Birdie Riddle MD, Katherine     Asthma    Dysgraphia 11/30/2015    No past surgical history on file.  No Known Allergies  Objective: Physical Exam General: The patient is alert and oriented x3 in no acute distress.  Dermatology: Hyperkeratotic, discolored, thickened, onychodystrophy noted bilateral great toes and right second digit. Skin is warm, dry and supple bilateral lower extremities. Negative for open lesions or macerations.  Vascular: Palpable pedal pulses bilaterally. No edema or erythema noted. Capillary refill within normal limits.  Neurological: Epicritic and protective threshold grossly intact bilaterally.   Musculoskeletal Exam: No pedal deformity noted  Assessment: #1 Onychomycosis of toenails bilateral great toes and right second digit #2 intermittent athlete's foot bilateral  Plan of Care:  #1 Patient was evaluated. #2  Today we discussed different treatment options including oral, topical, and laser antifungal treatment modalities.  We discussed their efficacies and side effects.  Patient opts for oral antifungal treatment modality #3 prescription for Lamisil 250 mg #90 daily. Pt denies a history of liver pathology or symptoms.  Patient is otherwise healthy #4  Also prescription for Lotrisone cream due to the intermittent itching and athlete's foot to the interdigital  areas of the toes  #5 return to clinic 6 months   Edrick Kins, DPM [facility]  Dr. Edrick Kins, DPM    [address], [place] [address]                Office [phone]  Fax [phone]

## 2022-11-01 ENCOUNTER — Telehealth: Payer: Self-pay | Admitting: *Deleted

## 2022-11-01 MED ORDER — CLOTRIMAZOLE-BETAMETHASONE 1-0.05 % EX CREA: 1.0000 | TOPICAL_CREAM | Freq: Two times a day (BID) | CUTANEOUS | 1 refills | Status: DC

## 2022-11-01 NOTE — Telephone Encounter (Signed)
Patient is calling for a refill of the Lotrisone foot cream,please advise. Refill sent to pharmacy.

## 2022-11-02 ENCOUNTER — Other Ambulatory Visit: Payer: Self-pay | Admitting: Podiatry

## 2022-11-02 MED ORDER — CLOTRIMAZOLE-BETAMETHASONE 1-0.05 % EX CREA: 1.0000 | TOPICAL_CREAM | Freq: Two times a day (BID) | CUTANEOUS | 1 refills | Status: AC

## 2023-01-22 ENCOUNTER — Other Ambulatory Visit: Payer: Self-pay | Admitting: Podiatry

## 2023-01-22 ENCOUNTER — Telehealth: Payer: Self-pay | Admitting: Podiatry

## 2023-01-22 MED ORDER — TERBINAFINE HCL 250 MG PO TABS: 250.0000 mg | ORAL_TABLET | Freq: Every day | ORAL | 0 refills | Status: AC

## 2023-01-22 NOTE — Telephone Encounter (Signed)
terbinafine (LAMISIL) 250 MG tablet  patient needs a refill on prescription please contact when complete. If patient needs an apt. please advise and schedule a f/u.

## 2023-01-30 ENCOUNTER — Encounter: Payer: Self-pay | Admitting: Podiatry

## 2023-01-30 ENCOUNTER — Ambulatory Visit: Payer: Commercial Managed Care - PPO | Admitting: Podiatry

## 2023-01-30 DIAGNOSIS — L600 Ingrowing nail: Secondary | ICD-10-CM

## 2023-01-30 MED ORDER — AMOXICILLIN-POT CLAVULANATE 875-125 MG PO TABS: 1.0000 | ORAL_TABLET | Freq: Two times a day (BID) | ORAL | 0 refills | Status: AC

## 2023-01-30 NOTE — Progress Notes (Signed)
   Chief Complaint  Patient presents with   Nail Problem    "The sides of my two big toes is bleeding constantly." N - toenail bleeding L - bilateral hallux D - last weekend O - gradually worse C - bleeding, sore A - shoes, walking, standing, pressure T - peroxide, neosporin, anti-fungus topical    Subjective: Patient presents today for evaluation of pain to the medial and lateral border bilateral great toes. Patient is concerned for possible ingrown nail.  It is very sensitive to touch.  Patient presents today for further treatment and evaluation.  Past Medical History:  Diagnosis Date   ADHD (attention deficit hyperactivity disorder), combined type 05/12/2010   Qualifier: Diagnosis of  By: Beverely Low MD, Katherine     Asthma    Dysgraphia 11/30/2015    Objective:  General: Well developed, nourished, in no acute distress, alert and oriented x3   Dermatology: Skin is warm, dry and supple bilateral.  Medial and lateral border bilateral great toes is tender with evidence of an ingrowing nail. Pain on palpation noted to the border of the nail fold. The remaining nails appear unremarkable at this time. There are no open sores, lesions.  Vascular: DP and PT pulses palpable.  No clinical evidence of vascular compromise  Neruologic: Grossly intact via light touch bilateral.  Musculoskeletal: No pedal deformity noted  Assesement: #1 Paronychia with ingrowing nail medial lateral border bilateral great toes  Plan of Care:  1. Patient evaluated.  2. Discussed treatment alternatives and plan of care. Explained nail avulsion procedure and post procedure course to patient. 3. Patient opted for permanent partial nail avulsion of the ingrown portion of the nail.  4. Prior to procedure, local anesthesia infiltration utilized using 3 ml of a 50:50 mixture of 2% plain lidocaine and 0.5% plain marcaine in a normal hallux block fashion and a betadine prep performed.  5. Partial permanent nail  avulsion with chemical matrixectomy performed using 3x30sec applications of phenol followed by alcohol flush.  6. Light dressing applied.  Post care instructions provided 7.  Prescription for Augmentin 875/125 mg x 7 days prophylaxis 8.  Return to clinic 2 weeks.  Felecia Shelling, DPM [facility]  Dr. Felecia Shelling, DPM    [address] [place], [place] [address]                Office [phone]  Fax [phone]

## 2023-02-27 ENCOUNTER — Ambulatory Visit: Payer: Commercial Managed Care - PPO | Admitting: Podiatry

## 2023-02-27 DIAGNOSIS — L600 Ingrowing nail: Secondary | ICD-10-CM | POA: Diagnosis not present

## 2023-02-27 NOTE — Progress Notes (Signed)
   Chief Complaint  Patient presents with   Ingrown Toenail    3 WEEKS STATUS POST PERMANENT IN GROWN TOENAIL PROCEDURE BILATERAL- BOTH BORDERS BOTH GREAT TOENAILS.  DOING WELL.  PAIN HAS RESOLVED. SLIGHT SWELING LEFT LATERAL SIDE.  DENIES N/V/F/C/NS    Subjective: 27 y.o. male presents today status post permanent nail avulsion procedure of the medial and lateral border of the bilateral great toes that was performed on 01/30/2023.  Patient doing well.  Feels much better.  No new complaints.   Past Medical History:  Diagnosis Date   ADHD (attention deficit hyperactivity disorder), combined type 05/12/2010   Qualifier: Diagnosis of  By: Beverely Low MD, Katherine     Asthma    Dysgraphia 11/30/2015    Objective: Neurovascular status intact.  Skin is warm, dry and supple. Nail and respective nail fold appears to be healing appropriately.   Assessment: #1 s/p partial permanent nail matrixectomy medial and lateral border of the bilateral great toes.  01/30/2023   Plan of care: #1 patient was evaluated  #2 light debridement of the periungual debris was performed to the border of the respective toe and nail plate using a tissue nipper. #3 patient is to return to clinic on a PRN basis.   Felecia Shelling, DPM [facility]  Dr. Felecia Shelling, DPM    [address] [place], [place] [address]                Office [phone]  Fax [phone]
